# Patient Record
Sex: Female | Born: 1955 | Race: White | Hispanic: No | Marital: Married | Smoking: Never smoker
Health system: Southern US, Community
[De-identification: ages and names within clinical notes are randomized; demographics above are authoritative.]

## PROBLEM LIST (undated history)

## (undated) DIAGNOSIS — C801 Malignant (primary) neoplasm, unspecified: Secondary | ICD-10-CM

## (undated) DIAGNOSIS — F32A Depression, unspecified: Secondary | ICD-10-CM

## (undated) DIAGNOSIS — E349 Endocrine disorder, unspecified: Secondary | ICD-10-CM

## (undated) DIAGNOSIS — F329 Major depressive disorder, single episode, unspecified: Secondary | ICD-10-CM

## (undated) DIAGNOSIS — F988 Other specified behavioral and emotional disorders with onset usually occurring in childhood and adolescence: Secondary | ICD-10-CM

## (undated) HISTORY — DX: Endocrine disorder, unspecified: E34.9

## (undated) HISTORY — DX: Depression, unspecified: F32.A

## (undated) HISTORY — DX: Malignant (primary) neoplasm, unspecified: C80.1

## (undated) HISTORY — DX: Other specified behavioral and emotional disorders with onset usually occurring in childhood and adolescence: F98.8

## (undated) HISTORY — PX: FINGER SURGERY: SHX640

## (undated) HISTORY — DX: Major depressive disorder, single episode, unspecified: F32.9

## (undated) HISTORY — PX: TUMOR REMOVAL: SHX12

---

## 1998-10-30 ENCOUNTER — Other Ambulatory Visit: Admission: RE | Admit: 1998-10-30 | Discharge: 1998-10-30 | Payer: Self-pay | Admitting: *Deleted

## 2001-10-31 ENCOUNTER — Other Ambulatory Visit: Admission: RE | Admit: 2001-10-31 | Discharge: 2001-10-31 | Payer: Self-pay | Admitting: *Deleted

## 2003-07-01 ENCOUNTER — Other Ambulatory Visit: Admission: RE | Admit: 2003-07-01 | Discharge: 2003-07-01 | Payer: Self-pay | Admitting: *Deleted

## 2003-09-17 ENCOUNTER — Other Ambulatory Visit: Admission: RE | Admit: 2003-09-17 | Discharge: 2003-09-17 | Payer: Self-pay | Admitting: *Deleted

## 2004-07-01 ENCOUNTER — Other Ambulatory Visit: Admission: RE | Admit: 2004-07-01 | Discharge: 2004-07-01 | Payer: Self-pay | Admitting: *Deleted

## 2005-03-21 ENCOUNTER — Other Ambulatory Visit: Admission: RE | Admit: 2005-03-21 | Discharge: 2005-03-21 | Payer: Self-pay | Admitting: *Deleted

## 2006-03-30 ENCOUNTER — Other Ambulatory Visit: Admission: RE | Admit: 2006-03-30 | Discharge: 2006-03-30 | Payer: Self-pay | Admitting: *Deleted

## 2007-06-05 ENCOUNTER — Other Ambulatory Visit: Admission: RE | Admit: 2007-06-05 | Discharge: 2007-06-05 | Payer: Self-pay | Admitting: *Deleted

## 2008-01-03 ENCOUNTER — Encounter: Admission: RE | Admit: 2008-01-03 | Discharge: 2008-01-03 | Payer: Self-pay | Admitting: *Deleted

## 2008-10-17 ENCOUNTER — Ambulatory Visit (HOSPITAL_COMMUNITY): Admission: RE | Admit: 2008-10-17 | Discharge: 2008-10-17 | Payer: Self-pay | Admitting: Family Medicine

## 2010-08-15 ENCOUNTER — Encounter: Payer: Self-pay | Admitting: Obstetrics and Gynecology

## 2010-08-25 ENCOUNTER — Encounter: Payer: Self-pay | Admitting: Obstetrics and Gynecology

## 2010-09-27 ENCOUNTER — Other Ambulatory Visit: Payer: Self-pay | Admitting: Family Medicine

## 2010-09-27 DIAGNOSIS — N63 Unspecified lump in unspecified breast: Secondary | ICD-10-CM

## 2010-10-01 ENCOUNTER — Ambulatory Visit
Admission: RE | Admit: 2010-10-01 | Discharge: 2010-10-01 | Disposition: A | Discharge status: Home / Self Care | Payer: BC Managed Care – PPO | Source: Ambulatory Visit | Attending: Family Medicine | Admitting: Family Medicine

## 2010-10-01 ENCOUNTER — Other Ambulatory Visit: Payer: Self-pay | Admitting: Family Medicine

## 2010-10-01 DIAGNOSIS — N63 Unspecified lump in unspecified breast: Secondary | ICD-10-CM

## 2011-06-07 ENCOUNTER — Ambulatory Visit (HOSPITAL_COMMUNITY)
Admission: RE | Admit: 2011-06-07 | Discharge: 2011-06-07 | Disposition: A | Discharge status: Home / Self Care | Payer: BC Managed Care – PPO | Source: Ambulatory Visit | Attending: Family Medicine | Admitting: Family Medicine

## 2011-06-07 ENCOUNTER — Other Ambulatory Visit (HOSPITAL_COMMUNITY): Payer: Self-pay | Admitting: Family Medicine

## 2011-06-07 DIAGNOSIS — M25879 Other specified joint disorders, unspecified ankle and foot: Secondary | ICD-10-CM

## 2011-06-07 DIAGNOSIS — M25579 Pain in unspecified ankle and joints of unspecified foot: Secondary | ICD-10-CM | POA: Insufficient documentation

## 2011-08-04 ENCOUNTER — Other Ambulatory Visit: Payer: Self-pay | Admitting: *Deleted

## 2011-08-04 NOTE — Telephone Encounter (Signed)
Opened in error

## 2011-11-15 ENCOUNTER — Other Ambulatory Visit: Payer: Self-pay | Admitting: Podiatry

## 2011-11-15 DIAGNOSIS — T148XXA Other injury of unspecified body region, initial encounter: Secondary | ICD-10-CM

## 2011-11-15 DIAGNOSIS — R52 Pain, unspecified: Secondary | ICD-10-CM

## 2011-11-19 ENCOUNTER — Other Ambulatory Visit: Payer: BC Managed Care – PPO

## 2011-11-21 ENCOUNTER — Inpatient Hospital Stay: Admission: RE | Admit: 2011-11-21 | Payer: BC Managed Care – PPO | Source: Ambulatory Visit

## 2011-11-30 ENCOUNTER — Other Ambulatory Visit: Payer: BC Managed Care – PPO

## 2011-12-05 ENCOUNTER — Ambulatory Visit
Admission: RE | Admit: 2011-12-05 | Discharge: 2011-12-05 | Disposition: A | Discharge status: Home / Self Care | Payer: BC Managed Care – PPO | Source: Ambulatory Visit | Attending: Podiatry | Admitting: Podiatry

## 2011-12-05 DIAGNOSIS — T148XXA Other injury of unspecified body region, initial encounter: Secondary | ICD-10-CM

## 2011-12-05 DIAGNOSIS — R52 Pain, unspecified: Secondary | ICD-10-CM

## 2012-06-05 ENCOUNTER — Other Ambulatory Visit: Payer: Self-pay | Admitting: Family Medicine

## 2012-06-05 DIAGNOSIS — Z1231 Encounter for screening mammogram for malignant neoplasm of breast: Secondary | ICD-10-CM

## 2012-06-18 ENCOUNTER — Ambulatory Visit
Admission: RE | Admit: 2012-06-18 | Discharge: 2012-06-18 | Disposition: A | Discharge status: Home / Self Care | Payer: BC Managed Care – PPO | Source: Ambulatory Visit | Attending: Family Medicine | Admitting: Family Medicine

## 2012-06-18 DIAGNOSIS — Z1231 Encounter for screening mammogram for malignant neoplasm of breast: Secondary | ICD-10-CM

## 2012-12-10 ENCOUNTER — Ambulatory Visit (INDEPENDENT_AMBULATORY_CARE_PROVIDER_SITE_OTHER): Payer: BC Managed Care – PPO | Admitting: Gynecology

## 2012-12-10 ENCOUNTER — Other Ambulatory Visit (HOSPITAL_COMMUNITY)
Admission: RE | Admit: 2012-12-10 | Discharge: 2012-12-10 | Disposition: A | Discharge status: Home / Self Care | Payer: BC Managed Care – PPO | Source: Ambulatory Visit | Attending: Gynecology | Admitting: Gynecology

## 2012-12-10 ENCOUNTER — Encounter: Payer: Self-pay | Admitting: Gynecology

## 2012-12-10 VITALS — BP 142/92 | Ht 64.25 in | Wt 166.0 lb

## 2012-12-10 DIAGNOSIS — Z01419 Encounter for gynecological examination (general) (routine) without abnormal findings: Secondary | ICD-10-CM

## 2012-12-10 DIAGNOSIS — Z780 Asymptomatic menopausal state: Secondary | ICD-10-CM | POA: Insufficient documentation

## 2012-12-10 DIAGNOSIS — Z79890 Hormone replacement therapy (postmenopausal): Secondary | ICD-10-CM

## 2012-12-10 DIAGNOSIS — E039 Hypothyroidism, unspecified: Secondary | ICD-10-CM

## 2012-12-10 DIAGNOSIS — Z1159 Encounter for screening for other viral diseases: Secondary | ICD-10-CM

## 2012-12-10 DIAGNOSIS — Z1151 Encounter for screening for human papillomavirus (HPV): Secondary | ICD-10-CM | POA: Insufficient documentation

## 2012-12-10 LAB — CBC WITH DIFFERENTIAL/PLATELET
Basophils Absolute: 0 10*3/uL (ref 0.0–0.1)
HCT: 38.5 % (ref 36.0–46.0)
Hemoglobin: 13.2 g/dL (ref 12.0–15.0)
Lymphocytes Relative: 48 % — ABNORMAL HIGH (ref 12–46)
Monocytes Absolute: 0.3 10*3/uL (ref 0.1–1.0)
Monocytes Relative: 6 % (ref 3–12)
Neutro Abs: 2.1 10*3/uL (ref 1.7–7.7)
Neutrophils Relative %: 42 % — ABNORMAL LOW (ref 43–77)
RDW: 13.3 % (ref 11.5–15.5)
WBC: 5 10*3/uL (ref 4.0–10.5)

## 2012-12-10 MED ORDER — PROGESTERONE MICRONIZED 200 MG PO CAPS: 200.0000 mg | ORAL_CAPSULE | Freq: Every day | ORAL | Status: DC

## 2012-12-10 MED ORDER — ESTRADIOL 0.52 MG/0.87 GM (0.06%) TD GEL: 1.0000 "application " | Freq: Every day | TRANSDERMAL | Status: DC

## 2012-12-10 NOTE — Progress Notes (Addendum)
Kristie Hansen 1955-11-11 [id number]   History:    57 y.o.  for annual gyn exam who has not been seen in this practice over 10 years. Patient stated she had a full medical and gynecological Pap smear at the medical clinic in [place], [place]. [place] early this year. She stated that she had a normal Pap smear. She stated 7 years ago she had LEEP cervical conization. Degree of dysplasia? Last mammogram November 2013. Patient is a recovered alcoholic. She stated she had a normal colonoscopy 2 years ago. She has not had a bone density study. 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 Lavonna Rua dermatologist for annual mole checks. She has a history of hypothyroidism where she is on 75 mcg of levothyroxin but her levels have not been checked in the past 6 months. She stated her last menstrual period was 3 years ago and that she is complaining of hot flashes irritability mood swing and insomnia. Patient has not been on hormone replacement therapy in the past.  Past medical history,surgical history, family history and social history were all reviewed and documented in the EPIC chart.  Gynecologic History No LMP recorded. Patient is postmenopausal. Contraception: post menopausal status Last Pap: November 2013. Results were: patient reports that was normal Last mammogram: November 2013. Results were: normal  Obstetric History OB History   Grav Para Term Preterm Abortions TAB SAB Ect Mult Living   2 2        2      # Outc Date GA Lbr Len/2nd Wgt Sex Del Anes PTL Lv   1 PAR            2 PAR                ROS: A ROS was performed and pertinent positives and negatives are included in the history.  GENERAL: No fevers or chills. HEENT: No change in vision, no earache, sore throat or sinus congestion. NECK: No pain or stiffness. CARDIOVASCULAR: No chest pain or pressure. No palpitations. PULMONARY: No shortness of breath,  cough or wheeze. GASTROINTESTINAL: No abdominal pain, nausea, vomiting or diarrhea, melena or bright red blood per rectum. GENITOURINARY: No urinary frequency, urgency, hesitancy or dysuria. MUSCULOSKELETAL: No joint or muscle pain, no back pain, no recent trauma. DERMATOLOGIC: No rash, no itching, no lesions. ENDOCRINE: No polyuria, polydipsia, no heat or cold intolerance. No recent change in weight. HEMATOLOGICAL: No anemia or easy bruising or bleeding. NEUROLOGIC: No headache, seizures, numbness, tingling or weakness. PSYCHIATRIC: No depression, no loss of interest in normal activity or change in sleep pattern.     Exam: chaperone present  BP 142/92  Ht 5' 4.25" (1.632 m)  Wt 166 lb (75.297 kg)  BMI 28.27 kg/m2  Body mass index is 28.27 kg/(m^2).  General appearance : Well developed well nourished female. No acute distress HEENT: Neck supple, trachea midline, no carotid bruits, no thyroidmegaly Lungs: Clear to auscultation, no rhonchi or wheezes, or rib retractions  Heart: Regular rate and rhythm, no murmurs or gallops Breast:Examined in sitting and supine position were symmetrical in appearance, no palpable masses or tenderness,  no skin retraction, no nipple inversion, no nipple discharge, no skin discoloration, no axillary or supraclavicular lymphadenopathy Abdomen: no palpable masses or tenderness, no rebound or guarding Extremities: no edema or skin discoloration or tenderness  Pelvic:  Bartholin, Urethra, Skene Glands: Within normal limits  Vagina: No gross lesions or discharge, atrophic changes  Cervix: No gross lesions or discharge  Uterus  anteverted, normal size, shape and consistency, non-tender and mobile  Adnexa  Without masses or tenderness  Anus and perineum  normal   Rectovaginal  normal sphincter tone without palpated masses or tenderness             Hemoccult cards provided   New CDC guidelines is recommending patients be tested once in her lifetime for  hepatitis C antibody who were born between [birth year] through 1963. This was discussed with the patient today and has agreed to be tested today.    Assessment/Plan:  57 y.o. female for annual exam Who has not been seen in our practice over 10 years. She will sign a medical release so that we can obtain the results of the pathology from her LEEP cervical conization 7 years ago. I've also requested her to obtain a copy of her mammogram and the lab work [facility] [place]. The following labs were ordered today: TSH, hepatitis C, comprehensive metabolic panel, FSH, CBC and urinalysis. Her Pap smear was done today and we did discuss the new guidelines. Literature on Tdap vaccine was provided. We had a lengthy discussion on hormone replacement therapy as to the risks benefits and pros and cons and the women's health initiative study. Patient states she's had normal liver function tests nevertheless we are going to check a comprehensive metabolic panel today. We will get the [facility] to confirm indeed if she is menopausal although by history in 2 years of being amenorrheic clearly indicates it. She will be started on elestrin 0.06% transdermal to apply to one arm each bedtime with the addition of Prometrium 200 mg for the first 12 days of each month. We discussed importance of calcium and vitamin D and regular exercise for osteoporosis prevention. We will do her bone density study next year.    Ok Edwards MD, 4:03 PM 12/10/2012

## 2012-12-10 NOTE — Addendum Note (Signed)
Addended by: Bertram Savin A on: 12/10/2012 04:17 PM   Modules accepted: Orders

## 2012-12-10 NOTE — Patient Instructions (Addendum)
Tetanus, Diphtheria, Pertussis (Tdap) Vaccine What You Need to Know WHY GET VACCINATED? Tetanus, diphtheria and pertussis can be very serious diseases, even for adolescents and adults. Tdap vaccine can protect us from these diseases. TETANUS (Lockjaw) causes painful muscle tightening and stiffness, usually all over the body.  It can lead to tightening of muscles in the head and neck so you can't open your mouth, swallow, or sometimes even breathe. Tetanus kills about 1 out of 5 people who are infected. DIPHTHERIA can cause a thick coating to form in the back of the throat.  It can lead to breathing problems, paralysis, heart failure, and death. PERTUSSIS (Whooping Cough) causes severe coughing spells, which can cause difficulty breathing, vomiting and disturbed sleep.  It can also lead to weight loss, incontinence, and rib fractures. Up to 2 in 100 adolescents and 5 in 100 adults with pertussis are hospitalized or have complications, which could include pneumonia and death. These diseases are caused by bacteria. Diphtheria and pertussis are spread from person to person through coughing or sneezing. Tetanus enters the body through cuts, scratches, or wounds. Before vaccines, the [place] saw as many as 200,000 cases a year of diphtheria and pertussis, and hundreds of cases of tetanus. Since vaccination began, tetanus and diphtheria have dropped by about 99% and pertussis by about 80%. TDAP VACCINE Tdap vaccine can protect adolescents and adults from tetanus, diphtheria, and pertussis. One dose of Tdap is routinely given at age 11 or 12. People who did not get Tdap at that age should get it as soon as possible. Tdap is especially important for health care professionals and anyone having close contact with a baby younger than 12 months. Pregnant women should get a dose of Tdap during every pregnancy, to protect the newborn from pertussis. Infants are most at risk for severe, life-threatening  complications from pertussis. A similar vaccine, called Td, protects from tetanus and diphtheria, but not pertussis. A Td booster should be given every 10 years. Tdap may be given as one of these boosters if you have not already gotten a dose. Tdap may also be given after a severe cut or burn to prevent tetanus infection. Your doctor can give you more information. Tdap may safely be given at the same time as other vaccines. SOME PEOPLE SHOULD NOT GET THIS VACCINE  If you ever had a life-threatening allergic reaction after a dose of any tetanus, diphtheria, or pertussis containing vaccine, OR if you have a severe allergy to any part of this vaccine, you should not get Tdap. Tell your doctor if you have any severe allergies.  If you had a coma, or long or multiple seizures within 7 days after a childhood dose of DTP or DTaP, you should not get Tdap, unless a cause other than the vaccine was found. You can still get Td.  Talk to your doctor if you:  have epilepsy or another nervous system problem,  had severe pain or swelling after any vaccine containing diphtheria, tetanus or pertussis,  ever had Guillain-Barr Syndrome (GBS),  aren't feeling well on the day the shot is scheduled. RISKS OF A VACCINE REACTION With any medicine, including vaccines, there is a chance of side effects. These are usually mild and go away on their own, but serious reactions are also possible. Brief fainting spells can follow a vaccination, leading to injuries from falling. Sitting or lying down for about 15 minutes can help prevent these. Tell your doctor if you feel dizzy or light-headed, or   have vision changes or ringing in the ears. Mild problems following Tdap (Did not interfere with activities)  Pain where the shot was given (about 3 in 4 adolescents or 2 in 3 adults)  Redness or swelling where the shot was given (about 1 person in 5)  Mild fever of at least 100.4F (up to about 1 in 25 adolescents or 1 in  100 adults)  Headache (about 3 or 4 people in 10)  Tiredness (about 1 person in 3 or 4)  Nausea, vomiting, diarrhea, stomach ache (up to 1 in 4 adolescents or 1 in 10 adults)  Chills, body aches, sore joints, rash, swollen glands (uncommon) Moderate problems following Tdap (Interfered with activities, but did not require medical attention)  Pain where the shot was given (about 1 in 5 adolescents or 1 in 100 adults)  Redness or swelling where the shot was given (up to about 1 in 16 adolescents or 1 in 25 adults)  Fever over 102F (about 1 in 100 adolescents or 1 in 250 adults)  Headache (about 3 in 20 adolescents or 1 in 10 adults)  Nausea, vomiting, diarrhea, stomach ache (up to 1 or 3 people in 100)  Swelling of the entire arm where the shot was given (up to about 3 in 100). Severe problems following Tdap (Unable to perform usual activities, required medical attention)  Swelling, severe pain, bleeding and redness in the arm where the shot was given (rare). A severe allergic reaction could occur after any vaccine (estimated less than 1 in a million doses). WHAT IF THERE IS A SERIOUS REACTION? What should I look for?  Look for anything that concerns you, such as signs of a severe allergic reaction, very high fever, or behavior changes. Signs of a severe allergic reaction can include hives, swelling of the face and throat, difficulty breathing, a fast heartbeat, dizziness, and weakness. These would start a few minutes to a few hours after the vaccination. What should I do?  If you think it is a severe allergic reaction or other emergency that can't wait, call 9-1-1 or get the person to the nearest hospital. Otherwise, call your doctor.  Afterward, the reaction should be reported to the "Vaccine Adverse Event Reporting System" (VAERS). Your doctor might file this report, or you can do it yourself through the VAERS web site at www.vaers.hhs.gov, or by calling 1-800-822-7967. VAERS is  only for reporting reactions. They do not give medical advice.  THE NATIONAL VACCINE INJURY COMPENSATION PROGRAM The National Vaccine Injury Compensation Program (VICP) is a federal program that was created to compensate people who may have been injured by certain vaccines. Persons who believe they may have been injured by a vaccine can learn about the program and about filing a claim by calling 1-800-338-2382 or visiting the VICP website at www.hrsa.gov/vaccinecompensation. HOW CAN I LEARN MORE?  Ask your doctor.  Call your local or state health department.  Contact the Centers for Disease Control and Prevention (CDC):  Call 1-800-232-4636 or visit CDC's website at www.cdc.gov/vaccines CDC Tdap Vaccine VIS (12/01/11) Document Released: 01/10/2012 Document Reviewed: 01/10/2012 ExitCare Patient Information 2013 [employer].   Hormone Therapy At menopause, your body begins making less estrogen and progesterone hormones. This causes the body to stop having menstrual periods. This is because estrogen and progesterone hormones control your periods and menstrual cycle. A lack of estrogen may cause symptoms such as:  Hot flushes (or hot flashes).  Vaginal dryness.  Dry skin.  Loss of sex drive.    Risk of bone loss (osteoporosis). When this happens, you may choose to take hormone therapy to get back the estrogen lost during menopause. When the hormone estrogen is given alone, it is usually referred to as ET (Estrogen Therapy). When the hormone progestin is combined with estrogen, it is generally called HT (Hormone Therapy). This was formerly known as hormone replacement therapy (HRT). Your caregiver can help you make a decision on what will be best for you. The decision to use HT seems to change often as new studies are done. Many studies do not agree on the benefits of hormone replacement therapy. LIKELY BENEFITS OF HT INCLUDE PROTECTION FROM:  Hot Flushes (also called hot flashes) - A  hot flush is a sudden feeling of heat that spreads over the face and body. The skin may redden like a blush. It is connected with sweats and sleep disturbance. Women going through menopause may have hot flushes a few times a month or several times per day depending on the woman.  Osteoporosis (bone loss)- Estrogen helps guard against bone loss. After menopause, a woman's bones slowly lose calcium and become weak and brittle. As a result, bones are more likely to break. The hip, wrist, and spine are affected most often. Hormone therapy can help slow bone loss after menopause. Weight bearing exercise and taking calcium with vitamin D also can help prevent bone loss. There are also medications that your caregiver can prescribe that can help prevent osteoporosis.  Vaginal Dryness - Loss of estrogen causes changes in the vagina. Its lining may become thin and dry. These changes can cause pain and bleeding during sexual intercourse. Dryness can also lead to infections. This can cause burning and itching. (Vaginal estrogen treatment can help relieve pain, itching, and dryness.)  Urinary Tract Infections are more common after menopause because of lack of estrogen. Some women also develop urinary incontinence because of low estrogen levels in the vagina and bladder.  Possible other benefits of estrogen include a positive effect on mood and short-term memory in women. RISKS AND COMPLICATIONS  Using estrogen alone without progesterone causes the lining of the uterus to grow. This increases the risk of lining of the uterus (endometrial) cancer. Your caregiver should give another hormone called progestin if you have a uterus.  Women who take combined (estrogen and progestin) HT appear to have an increased risk of breast cancer. The risk appears to be small, but increases throughout the time that HT is taken.  Combined therapy also makes the breast tissue slightly denser which makes it harder to read mammograms  (breast X-rays).  Combined, estrogen and progesterone therapy can be taken together every day, in which case there may be spotting of blood. HT therapy can be taken cyclically in which case you will have menstrual periods. Cyclically means HT is taken for a set amount of days, then not taken, then this process is repeated.  HT may increase the risk of stroke, heart attack, breast cancer and forming blood clots in your leg.  Transdermal estrogen (estrogen that is absorbed through the skin with a patch or a cream) may have more positive results with:  Cholesterol.  Blood pressure.  Blood clots. Having the following conditions may indicate you should not have HT:  Endometrial cancer.  Liver disease.  Breast cancer.  Heart disease.  History of blood clots.  Stroke. TREATMENT   If you choose to take HT and have a uterus, usually estrogen and progestin are prescribed.  Your caregiver will   help you decide the best way to take the medications.  Possible ways to take estrogen include:  Pills.  Patches.  Gels.  Sprays.  Vaginal estrogen cream, rings and tablets.  It is best to take the lowest dose possible that will help your symptoms and take them for the shortest period of time that you can.  Hormone therapy can help relieve some of the problems (symptoms) that affect women at menopause. Before making a decision about HT, talk to your caregiver about what is best for you. Be well informed and comfortable with your decisions. HOME CARE INSTRUCTIONS   Follow your caregivers advice when taking the medications.  A Pap test is done to screen for cervical cancer.  The first Pap test should be done at age 21.  Between ages 21 and 29, Pap tests are repeated every 2 years.  Beginning at age 30, you are advised to have a Pap test every 3 years as long as your past 3 Pap tests have been normal.  Some women have medical problems that increase the chance of getting cervical  cancer. Talk to your caregiver about these problems. It is especially important to talk to your caregiver if a new problem develops soon after your last Pap test. In these cases, your caregiver may recommend more frequent screening and Pap tests.  The above recommendations are the same for women who have or have not gotten the vaccine for HPV (Human Papillomavirus).  If you had a hysterectomy for a problem that was not a cancer or a condition that could lead to cancer, then you no longer need Pap tests. However, even if you no longer need a Pap test, a regular exam is a good idea to make sure no other problems are starting.   If you are between ages 65 and 70, and you have had normal Pap tests going back 10 years, you no longer need Pap tests. However, even if you no longer need a Pap test, a regular exam is a good idea to make sure no other problems are starting.   If you have had past treatment for cervical cancer or a condition that could lead to cancer, you need Pap tests and screening for cancer for at least 20 years after your treatment.  If Pap tests have been discontinued, risk factors (such as a new sexual partner) need to be re-assessed to determine if screening should be resumed.  Some women may need screenings more often if they are at high risk for cervical cancer.  Get mammograms done as per the advice of your caregiver. SEEK IMMEDIATE MEDICAL CARE IF:  You develop abnormal vaginal bleeding.  You have pain or swelling in your legs, shortness of breath, or chest pain.  You develop dizziness or headaches.  You have lumps or changes in your breasts or armpits.  You have slurred speech.  You develop weakness or numbness of your arms or legs.  You have pain, burning, or bleeding when urinating.  You develop abdominal pain. Document Released: 04/09/2003 Document Revised: 10/03/2011 Document Reviewed: 07/28/2010 ExitCare Patient Information 2013 [employer].  

## 2012-12-11 LAB — URINALYSIS W MICROSCOPIC + REFLEX CULTURE
Bacteria, UA: NONE SEEN
Bilirubin Urine: NEGATIVE
Glucose, UA: NEGATIVE mg/dL
Hgb urine dipstick: NEGATIVE
Ketones, ur: NEGATIVE mg/dL
Protein, ur: NEGATIVE mg/dL
Squamous Epithelial / LPF: NONE SEEN
Urobilinogen, UA: 0.2 mg/dL (ref 0.0–1.0)

## 2012-12-11 LAB — COMPREHENSIVE METABOLIC PANEL
AST: 20 U/L (ref 0–37)
Albumin: 4.5 g/dL (ref 3.5–5.2)
BUN: 18 mg/dL (ref 6–23)
Calcium: 9.4 mg/dL (ref 8.4–10.5)
Chloride: 102 mEq/L (ref 96–112)
Potassium: 3.9 mEq/L (ref 3.5–5.3)
Sodium: 134 mEq/L — ABNORMAL LOW (ref 135–145)
Total Protein: 7.3 g/dL (ref 6.0–8.3)

## 2012-12-11 LAB — TSH: TSH: 2.245 u[IU]/mL (ref 0.350–4.500)

## 2012-12-11 LAB — HEPATITIS C ANTIBODY: HCV Ab: NEGATIVE

## 2013-10-15 ENCOUNTER — Other Ambulatory Visit: Payer: Self-pay

## 2013-10-15 DIAGNOSIS — Z1231 Encounter for screening mammogram for malignant neoplasm of breast: Secondary | ICD-10-CM

## 2013-10-15 DIAGNOSIS — Z803 Family history of malignant neoplasm of breast: Secondary | ICD-10-CM

## 2013-11-20 ENCOUNTER — Other Ambulatory Visit: Payer: Self-pay | Admitting: Family Medicine

## 2013-11-20 DIAGNOSIS — Z1231 Encounter for screening mammogram for malignant neoplasm of breast: Secondary | ICD-10-CM

## 2013-11-20 DIAGNOSIS — Z803 Family history of malignant neoplasm of breast: Secondary | ICD-10-CM

## 2013-11-21 ENCOUNTER — Other Ambulatory Visit: Payer: Self-pay | Admitting: Family Medicine

## 2013-11-21 ENCOUNTER — Ambulatory Visit: Payer: BC Managed Care – PPO

## 2013-11-21 ENCOUNTER — Encounter (INDEPENDENT_AMBULATORY_CARE_PROVIDER_SITE_OTHER): Payer: Self-pay

## 2013-11-21 DIAGNOSIS — N63 Unspecified lump in unspecified breast: Secondary | ICD-10-CM

## 2013-11-21 DIAGNOSIS — Z803 Family history of malignant neoplasm of breast: Secondary | ICD-10-CM

## 2013-11-26 ENCOUNTER — Other Ambulatory Visit: Payer: Self-pay | Admitting: Family Medicine

## 2013-11-28 ENCOUNTER — Encounter: Payer: Self-pay | Admitting: Gynecology

## 2013-11-28 ENCOUNTER — Telehealth: Payer: Self-pay | Admitting: *Deleted

## 2013-11-28 ENCOUNTER — Ambulatory Visit (INDEPENDENT_AMBULATORY_CARE_PROVIDER_SITE_OTHER): Payer: BC Managed Care – PPO | Admitting: Gynecology

## 2013-11-28 VITALS — BP 140/88

## 2013-11-28 DIAGNOSIS — N644 Mastodynia: Secondary | ICD-10-CM

## 2013-11-28 DIAGNOSIS — N951 Menopausal and female climacteric states: Secondary | ICD-10-CM

## 2013-11-28 DIAGNOSIS — Z780 Asymptomatic menopausal state: Secondary | ICD-10-CM

## 2013-11-28 DIAGNOSIS — Z803 Family history of malignant neoplasm of breast: Secondary | ICD-10-CM

## 2013-11-28 NOTE — Telephone Encounter (Signed)
Orders placed at breast center, they will contact patient to schedule.

## 2013-11-28 NOTE — Progress Notes (Signed)
   Patient is a 58 year old who presented to the office today stating that for the past 6-8 months she didn't complain some tenderness in swollen area she is noted on her right axilla. She had a normal mammogram last year. She is informed that her sister was diagnosed with breast cancer at the age of 37 a few months ago. Patient denied any recent injury or trauma to that breast. She denied any nipple discharge. She's currently on HRT consisting of Elestrin transdermal 2 one arm daily with the addition of Prometrium 200 mg for 12 days of the month for vasomotor symptoms which she states this helped her tremendously.  Exam: Both breasts were examined sitting supine position both breasts were symmetrical in appearance although the right breast is slightly larger than her left since birth. There was no discoloration no nipple inversion no supraclavicular axillary lymphadenopathy and no palpable masses or tenderness.  The area the patient prefer to was questionable whether was a ridge from the axilla or a true swollen area.  The patient will be sent for a diagnostic mammogram of the right breast and axilla and screening mammogram on the left. She will check with her sister to see if they did a BRCA1 and BRCA2 gene mutation study. She will schedule as her bone density study and is due for her annual exam here at our office within the next month.

## 2013-11-28 NOTE — Patient Instructions (Addendum)
BRCA-1 and BRCA-2 BRCA-1 and BRCA-2 are 2 genes that are linked with hereditary breast and ovarian cancers. About 200,000 women are diagnosed with invasive breast cancer each year and about 23,000 with ovarian cancer (according to the American Cancer Society). Of these cancers, about 5% to 10% will be due to a mutation in one of the BRCA genes. Men can also inherit an increased risk of developing breast cancer, primarily from an alteration in the BRCA-2 gene.  Individuals with mutations in BRCA1 or BRCA2 have significantly elevated risks for breast cancer (up to 80% lifetime risk), ovarian cancer (up to 40% lifetime risk), bilateral breast cancer and other types of cancers. BRCA mutations are inherited and passed from generation to generation. One half of the time, they are passed from the father's side of the family.  The DNA in white blood cells is used to detect mutations in the BRCA genes. While the gene products (proteins) of the BRCA genes act only in breast and ovarian tissue, the genes are present in every cell of the body and blood is the most easily accessible source of that DNA. PREPARATION FOR TEST The test for BRCA mutations is done on a blood sample collected by needle from a vein in the arm. The test does not require surgical biopsy of breast or ovarian tissue.  NORMAL FINDINGS No genetic mutations. Ranges for normal findings may vary among different laboratories and hospitals. You should always check with your doctor after having lab work or other tests done to discuss the meaning of your test results and whether your values are considered within normal limits. MEANING OF TEST  Your caregiver will go over the test results with you and discuss the importance and meaning of your results, as well as treatment options and the need for additional tests if necessary. OBTAINING THE TEST RESULTS It is your responsibility to obtain your test results. Ask the lab or department performing the test  when and how you will get your results. OTHER THINGS TO KNOW Your test results may have implications for other family members. When one member of a family is tested for BRCA mutations, issues often arise about how or whether to share this information with other family members. Seek advice from a genetic counselor about communication of result with your family members.  Pre and post test consultation with a health care provider knowledgeable about genetic testing cannot be overemphasized.  There are many issues to be considered when preparing for a genetic test and upon learning the results, and a genetic counselor has the knowledge and experience to help you sort through them.  If the BRCA test is positive, the options include increased frequency of check-ups (e.g., mammography, blood tests for CA-125, or transvaginal ultrasonography); medications that could reduce risk (e.g., oral contraceptives or tamoxifen); or surgical removal of the ovaries or breasts. There are a number of variables involved and it is important to discuss your options with your doctor and genetic counselor. Research studies have reported that for every 1000 women negative for BRCA mutations, between 12 and 45 of them will develop breast cancer by age 50 and between 3 and 4 will develop ovarian cancer by age 50. The risk increases with age. The test can be ordered by a doctor, preferably by one who can also offer genetic counseling. The blood sample will be sent to a laboratory that specializes in BRCA testing. The American Society of Clinical Oncology and the National Breast Cancer Coalition encourage women seeking the   test to participate in long-term outcome studies to help gather information on the effectiveness of different check-up and treatment options. Document Released: 08/04/2004 Document Revised: 10/03/2011 Document Reviewed: 06/16/2008 [facility] Patient Information 2014 [place], [place]. Bone Densitometry Bone densitometry  is a special X-ray that measures your bone density and can be used to help predict your risk of bone fractures. This test is used to determine bone mineral content and density to diagnose osteoporosis. Osteoporosis is the loss of bone that may cause the bone to become weak. Osteoporosis commonly occurs in women entering menopause. However, it may be found in men and in people with other diseases. PREPARATION FOR TEST No preparation necessary. WHO SHOULD BE TESTED?  All women older than 66.  Postmenopausal women (50 to 28) with risk factors for osteoporosis.  People with a previous fracture caused by normal activities.  People with a small body frame (less than 127 poundsor a body mass index [BMI] of less than 21).  People who have a parent with a hip fracture or history of osteoporosis.  People who smoke.  People who have rheumatoid arthritis.  Anyone who engages in excessive alcohol use (more than 3 drinks most days).  Women who experience early menopause. WHEN SHOULD YOU BE RETESTED? Current guidelines suggest that you should wait at least 2 years before doing a bone density test again if your first test was normal.Recent studies indicated that women with normal bone density may be able to wait a few years before needing to repeat a bone density test. You should discuss this with your caregiver.  NORMAL FINDINGS   Normal: less than standard deviation below normal (greater than -1).  Osteopenia: 1 to 2.5 standard deviations below normal (-1 to -2.5).  Osteoporosis: greater than 2.5 standard deviations below normal (less than -2.5). Test results are reported as a "T score" and a "Z score."The T score is a number that compares your bone density with the bone density of healthy, young women.The Z score is a number that compares your bone density with the scores of women who are the same age, gender, and race.  Ranges for normal findings may vary among different laboratories and  hospitals. You should always check with your doctor after having lab work or other tests done to discuss the meaning of your test results and whether your values are considered within normal limits. MEANING OF TEST  Your caregiver will go over the test results with you and discuss the importance and meaning of your results, as well as treatment options and the need for additional tests if necessary. OBTAINING THE TEST RESULTS It is your responsibility to obtain your test results. Ask the lab or department performing the test when and how you will get your results. Document Released: 08/02/2004 Document Revised: 10/03/2011 Document Reviewed: 08/25/2010 [facility] Patient Information 2014 [place].

## 2013-11-28 NOTE — Telephone Encounter (Signed)
Message copied by Thamas Jaegers on Thu Nov 28, 2013  2:17 PM ------      Message from: Terrance Mass      Created: Thu Nov 28, 2013 11:57 AM       Please schedule diagnostic mammogram right breast and screening mammogram of left. Tenderness and questionable nodularity left axilla. Sister diagnosed with breast ca. ------

## 2013-12-02 NOTE — Telephone Encounter (Signed)
Wrong order was placed, correct order now placed breast center will contact pt to schedule.

## 2013-12-05 NOTE — Telephone Encounter (Signed)
Breast center left message on 12/04/13 for pt to call to schedule.

## 2013-12-11 NOTE — Telephone Encounter (Signed)
Pt asked to be seen at [facility] for the below imaging appointment on 12/18/13 @ 11:15 am.

## 2013-12-13 ENCOUNTER — Encounter: Payer: BC Managed Care – PPO | Admitting: Gynecology

## 2013-12-18 ENCOUNTER — Ambulatory Visit (HOSPITAL_COMMUNITY)
Admission: RE | Admit: 2013-12-18 | Discharge: 2013-12-18 | Disposition: A | Discharge status: Home / Self Care | Payer: BC Managed Care – PPO | Source: Ambulatory Visit | Attending: Gynecology | Admitting: Gynecology

## 2013-12-18 ENCOUNTER — Other Ambulatory Visit: Payer: Self-pay | Admitting: Gynecology

## 2013-12-18 DIAGNOSIS — Z780 Asymptomatic menopausal state: Secondary | ICD-10-CM

## 2013-12-18 DIAGNOSIS — N644 Mastodynia: Secondary | ICD-10-CM | POA: Insufficient documentation

## 2013-12-18 DIAGNOSIS — Z79890 Hormone replacement therapy (postmenopausal): Secondary | ICD-10-CM | POA: Insufficient documentation

## 2014-01-02 ENCOUNTER — Ambulatory Visit (INDEPENDENT_AMBULATORY_CARE_PROVIDER_SITE_OTHER): Payer: BC Managed Care – PPO | Admitting: Gynecology

## 2014-01-02 ENCOUNTER — Encounter: Payer: Self-pay | Admitting: Gynecology

## 2014-01-02 ENCOUNTER — Other Ambulatory Visit (HOSPITAL_COMMUNITY)
Admission: RE | Admit: 2014-01-02 | Discharge: 2014-01-02 | Disposition: A | Discharge status: Home / Self Care | Payer: BC Managed Care – PPO | Source: Ambulatory Visit | Attending: Gynecology | Admitting: Gynecology

## 2014-01-02 VITALS — BP 146/84 | Ht 64.75 in | Wt 167.0 lb

## 2014-01-02 DIAGNOSIS — Z780 Asymptomatic menopausal state: Secondary | ICD-10-CM

## 2014-01-02 DIAGNOSIS — Z79890 Hormone replacement therapy (postmenopausal): Secondary | ICD-10-CM

## 2014-01-02 DIAGNOSIS — Z01419 Encounter for gynecological examination (general) (routine) without abnormal findings: Secondary | ICD-10-CM

## 2014-01-02 DIAGNOSIS — N951 Menopausal and female climacteric states: Secondary | ICD-10-CM

## 2014-01-02 DIAGNOSIS — Z87410 Personal history of cervical dysplasia: Secondary | ICD-10-CM

## 2014-01-02 MED ORDER — PREDNISONE (PAK) 10 MG PO TABS: ORAL_TABLET | Freq: Every day | ORAL | Status: DC

## 2014-01-02 MED ORDER — HYDROXYZINE HCL 25 MG PO TABS: 25.0000 mg | ORAL_TABLET | Freq: Three times a day (TID) | ORAL | Status: DC | PRN

## 2014-01-02 NOTE — Patient Instructions (Signed)
Tetanus, Diphtheria (Td) Vaccine What You Need to Know WHY GET VACCINATED? Tetanus  and diphtheria are very serious diseases. They are rare in the [place] today, but people who do become infected often have severe complications. Td vaccine is used to protect adolescents and adults from both of these diseases. Both tetanus and diphtheria are infections caused by bacteria. Diphtheria spreads from person to person through coughing or sneezing. Tetanus-causing bacteria enter the body through cuts, scratches, or wounds. TETANUS (Lockjaw) causes painful muscle tightening and stiffness, usually all over the body.  It can lead to tightening of muscles in the head and neck so you can't open your mouth, swallow, or sometimes even breathe. Tetanus kills about 1 out of every 5 people who are infected. DIPHTHERIA can cause a thick coating to form in the back of the throat.  It can lead to breathing problems, paralysis, heart failure, and death. Before vaccines, the [place] saw as many as 200,000 cases a year of diphtheria and hundreds of cases of tetanus. Since vaccination began, cases of both diseases have dropped by about 99%. TD VACCINE Td vaccine can protect adolescents and adults from tetanus and diphtheria. Td is usually given as a booster dose every 10 years but it can also be given earlier after a severe and dirty wound or burn. Your doctor can give you more information. Td may safely be given at the same time as other vaccines. SOME PEOPLE SHOULD NOT GET THIS VACCINE  If you ever had a life-threatening allergic reaction after a dose of any tetanus or diphtheria containing vaccine, OR if you have a severe allergy to any part of this vaccine, you should not get Td. Tell your doctor if you have any severe allergies.  Talk to your doctor if you:  have epilepsy or another nervous system problem,  had severe pain or swelling after any vaccine containing diphtheria or tetanus,  ever had  Guillain Barr Syndrome (GBS),  aren't feeling well on the day the shot is scheduled. RISKS OF A VACCINE REACTION With a vaccine, like any medicine, there is a chance of side effects. These are usually mild and go away on their own. Serious side effects are also possible, but are very rare. Most people who get Td vaccine do not have any problems with it. Mild Problems  following Td (Did not interfere with activities)  Pain where the shot was given (about 8 people in 10)  Redness or swelling where the shot was given (about 1 person in 3)  Mild fever (about 1 person in 15)  Headache or Tiredness (uncommon) Moderate Problems following Td (Interfered with activities, but did not require medical attention)  Fever over 102 F (38.9 C) (rare) Severe Problems  following Td (Unable to perform usual activities; required medical attention)  Swelling, severe pain, bleeding, or redness in the arm where the shot was given (rare). Problems that could happen after any vaccine:  Brief fainting spells can happen after any medical procedure, including vaccination. Sitting or lying down for about 15 minutes can help prevent fainting, and injuries caused by a fall. Tell your doctor if you feel dizzy, or have vision changes or ringing in the ears.  Severe shoulder pain and reduced range of motion in the arm where a shot was given can happen, very rarely, after a vaccination.  Severe allergic reactions from a vaccine are very rare, estimated at less than 1 in a million doses. If one were to occur, it would   usually be within a few minutes to a few hours after the vaccination. WHAT IF THERE IS A SERIOUS REACTION? What should I look for?  Look for anything that concerns you, such as signs of a severe allergic reaction, very high fever, or behavior changes. Signs of a severe allergic reaction can include hives, swelling of the face and throat, difficulty breathing, a fast heartbeat, dizziness, and  weakness. These would usually start a few minutes to a few hours after the vaccination. What should I do?  If you think it is a severe allergic reaction or other emergency that can't wait, call 911 or get the person to the nearest hospital. Otherwise, call your doctor.  Afterward, the reaction should be reported to the Vaccine Adverse Event Reporting System (VAERS). Your doctor might file this report, or, you can do it yourself through the VAERS website or by calling 1-800-822-7967. VAERS is only for reporting reactions. They do not give medical advice. THE NATIONAL VACCINE INJURY COMPENSATION PROGRAM The National Vaccine Injury Compensation Program (VICP) is a federal program that was created to compensate people who may have been injured by certain vaccines. Persons who believe they may have been injured by a vaccine can learn about the program and about filing a claim by calling 1-800-338-2382 or visiting the VICP website. HOW CAN I LEARN MORE?  Ask your doctor.  Contact your local or state health department.  Contact the Centers for Disease Control and Prevention (CDC):  Call 1-800-232-4636 (1-800-CDC-INFO)  Visit CDC's vaccines website CDC Td Vaccine Interim VIS (08/28/12) Document Released: 05/08/2006 Document Revised: 11/05/2012 Document Reviewed: 10/31/2012 [employer] Patient Information 2014 [employer].  

## 2014-01-02 NOTE — Progress Notes (Signed)
Kristie Hansen 01/30/1956 [id number]   History:    58 y.o.  for annual gyn exam with only complaint was that she recently experienced Chigger bites was complaining of pruritus. Patient was previously been followed by her doctor in a clinic in [place], [place]  But has not been seen there for over a year. She stated that 8 years ago she had LEEP cervical conization. Degree of dysplasia? Patient is a recovered alcoholic. She stated she had a normal colonoscopy 3 years ago. 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 Otho Bellows dermatologist for annual mole checks. She has a history of hypothyroidism where she is on 75 mcg of levothyroxin . Her last menstrual cycle was 4 years ago. Patient has not been on any hormone replacement therapy. She does complain of constant irritability and mood swings. Her symptoms have improved since she was started on 0.06% transdermal to apply to one arm each bedtime with the addition of Prometrium 200 mg for the first 12 days of each month. Patient denies any vaginal bleeding.    Past medical history,surgical history, family history and social history were all reviewed and documented in the EPIC chart.  Gynecologic History No LMP recorded. Patient is postmenopausal. Contraception: post menopausal status Last Pap: 2014. Results were: normal Last mammogram: 2015. Results were: normal  Obstetric History OB History  Gravida Para Term Preterm AB SAB TAB Ectopic Multiple Living  2 2        2     # Outcome Date GA Lbr Len/2nd Weight Sex Delivery Anes PTL Lv  2 PAR           1 PAR                ROS: A ROS was performed and pertinent positives and negatives are included in the history.  GENERAL: No fevers or chills. HEENT: No change in vision, no earache, sore throat or sinus congestion. NECK: No pain or stiffness. CARDIOVASCULAR:  No chest pain or pressure. No palpitations. PULMONARY: No shortness of breath, cough or wheeze. GASTROINTESTINAL: No abdominal pain, nausea, vomiting or diarrhea, melena or bright red blood per rectum. GENITOURINARY: No urinary frequency, urgency, hesitancy or dysuria. MUSCULOSKELETAL: No joint or muscle pain, no back pain, no recent trauma. DERMATOLOGIC: No rash, no itching, no lesions. ENDOCRINE: No polyuria, polydipsia, no heat or cold intolerance. No recent change in weight. HEMATOLOGICAL: No anemia or easy bruising or bleeding. NEUROLOGIC: No headache, seizures, numbness, tingling or weakness. PSYCHIATRIC: No depression, no loss of interest in normal activity or change in sleep pattern.     Exam: chaperone present  BP 146/84  Ht 5' 4.75" (1.645 m)  Wt 167 lb (75.751 kg)  BMI 27.99 kg/m2  Body mass index is 27.99 kg/(m^2).  General appearance : Well developed well nourished female. No acute distress HEENT: Neck supple, trachea midline, no carotid bruits, no thyroidmegaly Lungs: Clear to auscultation, no rhonchi or wheezes, or rib retractions  Heart: Regular rate and rhythm, no murmurs or gallops Breast:Examined in sitting and supine position were symmetrical in appearance, no palpable masses or tenderness,  no skin retraction, no nipple inversion, no nipple discharge, no skin discoloration, no axillary or supraclavicular lymphadenopathy Abdomen: no palpable masses or tenderness, no rebound or guarding Extremities: no edema or skin discoloration or tenderness  Pelvic:  Bartholin, Urethra, Skene Glands:  Within normal limits             Vagina: No gross lesions or discharge  Cervix: No gross lesions or discharge  Uterus  anteverted, normal size, shape and consistency, non-tender and mobile  Adnexa  Without masses or tenderness  Anus and perineum  normal   Rectovaginal  normal sphincter tone without palpated masses or tenderness             Hemoccult cards provided      Assessment/Plan:  58 y.o. female for annual exam who has responded well on HRT. Patient had her Pap smear today and we will continue to do so at yearly because of her past history of LEEP cone biopsy and [place] not having the report. She was reminded of the importance of monthly breast exam. She was reminded to submit to the office the fecal Hemoccult cards for testing. For herChigger bite she is going to be placed on a Prednisone (Sterapred Unipak) 10 mg to take over 6 days. She is also going to be prescribed Vistaril 25 mg to take twice a day or 3 times a day when necessary and she can apply topically Calamine or Benadryl lotions. She'll return back to the office in a fasting state next week for the following labs: CBC, fasting lipid profile, conference metabolic panel, TSH and urinalysis. We discussed the importance of calcium and vitamin D and regular exercise for osteoporosis prevention.  Note: This dictation was prepared with  Dragon/digital dictation along withSmart phrase technology. Any transcriptional errors that result from this process are unintentional.   Terrance Mass MD, 4:57 PM 01/02/2014

## 2014-01-06 ENCOUNTER — Other Ambulatory Visit: Payer: Self-pay

## 2014-01-06 MED ORDER — PROGESTERONE MICRONIZED 200 MG PO CAPS: 200.0000 mg | ORAL_CAPSULE | Freq: Every day | ORAL | Status: DC

## 2014-01-07 LAB — CYTOLOGY - PAP

## 2014-01-30 ENCOUNTER — Other Ambulatory Visit: Payer: Self-pay

## 2014-01-30 MED ORDER — ESTRADIOL 0.52 MG/0.87 GM (0.06%) TD GEL: 1.0000 "application " | Freq: Every day | TRANSDERMAL | Status: DC

## 2014-05-09 ENCOUNTER — Other Ambulatory Visit: Payer: Self-pay

## 2014-05-15 ENCOUNTER — Ambulatory Visit: Payer: BC Managed Care – PPO | Admitting: Gynecology

## 2014-05-19 ENCOUNTER — Other Ambulatory Visit: Payer: Self-pay | Admitting: Gynecology

## 2014-05-19 ENCOUNTER — Encounter: Payer: Self-pay | Admitting: Gynecology

## 2014-05-19 ENCOUNTER — Ambulatory Visit (INDEPENDENT_AMBULATORY_CARE_PROVIDER_SITE_OTHER): Payer: BC Managed Care – PPO | Admitting: Gynecology

## 2014-05-19 VITALS — BP 146/90

## 2014-05-19 DIAGNOSIS — Z23 Encounter for immunization: Secondary | ICD-10-CM

## 2014-05-19 DIAGNOSIS — Z01419 Encounter for gynecological examination (general) (routine) without abnormal findings: Secondary | ICD-10-CM

## 2014-05-19 DIAGNOSIS — N950 Postmenopausal bleeding: Secondary | ICD-10-CM

## 2014-05-19 DIAGNOSIS — Z780 Asymptomatic menopausal state: Secondary | ICD-10-CM

## 2014-05-19 DIAGNOSIS — N83319 Acquired atrophy of ovary, unspecified side: Secondary | ICD-10-CM

## 2014-05-19 LAB — COMPREHENSIVE METABOLIC PANEL
ALT: 14 U/L (ref 0–35)
AST: 16 U/L (ref 0–37)
Albumin: 4.2 g/dL (ref 3.5–5.2)
Alkaline Phosphatase: 71 U/L (ref 39–117)
BILIRUBIN TOTAL: 0.3 mg/dL (ref 0.2–1.2)
BUN: 15 mg/dL (ref 6–23)
CO2: 27 meq/L (ref 19–32)
CREATININE: 0.66 mg/dL (ref 0.50–1.10)
Calcium: 9.3 mg/dL (ref 8.4–10.5)
Chloride: 101 mEq/L (ref 96–112)
GLUCOSE: 91 mg/dL (ref 70–99)
Potassium: 4.7 mEq/L (ref 3.5–5.3)
Sodium: 135 mEq/L (ref 135–145)
Total Protein: 7.2 g/dL (ref 6.0–8.3)

## 2014-05-19 LAB — CBC WITH DIFFERENTIAL/PLATELET
Basophils Absolute: 0.1 10*3/uL (ref 0.0–0.1)
Basophils Relative: 1 % (ref 0–1)
EOS ABS: 0.2 10*3/uL (ref 0.0–0.7)
EOS PCT: 3 % (ref 0–5)
HCT: 38.7 % (ref 36.0–46.0)
HEMOGLOBIN: 13.5 g/dL (ref 12.0–15.0)
Lymphocytes Relative: 37 % (ref 12–46)
Lymphs Abs: 2.3 10*3/uL (ref 0.7–4.0)
MCH: 30 pg (ref 26.0–34.0)
MCHC: 34.9 g/dL (ref 30.0–36.0)
MCV: 86 fL (ref 78.0–100.0)
MONOS PCT: 7 % (ref 3–12)
Monocytes Absolute: 0.4 10*3/uL (ref 0.1–1.0)
Neutro Abs: 3.2 10*3/uL (ref 1.7–7.7)
Neutrophils Relative %: 52 % (ref 43–77)
Platelets: 275 10*3/uL (ref 150–400)
RBC: 4.5 MIL/uL (ref 3.87–5.11)
RDW: 12.3 % (ref 11.5–15.5)
WBC: 6.1 10*3/uL (ref 4.0–10.5)

## 2014-05-19 LAB — LIPID PANEL
CHOL/HDL RATIO: 3.4 ratio
CHOLESTEROL: 182 mg/dL (ref 0–200)
HDL: 54 mg/dL (ref >39)
LDL Cholesterol: 105 mg/dL — ABNORMAL HIGH (ref 0–99)
Triglycerides: 114 mg/dL (ref <150)
VLDL: 23 mg/dL (ref 0–40)

## 2014-05-19 LAB — TSH: TSH: 2.353 u[IU]/mL (ref 0.350–4.500)

## 2014-05-19 NOTE — Patient Instructions (Signed)
Influenza Virus Vaccine injection (Fluarix) What is this medicine? INFLUENZA VIRUS VACCINE (in floo EN zuh VAHY ruhs vak SEEN) helps to reduce the risk of getting influenza also known as the flu. This medicine may be used for other purposes; ask your health care provider or pharmacist if you have questions. COMMON BRAND NAME(S): Fluarix, Fluzone What should I tell my health care provider before I take this medicine? They need to know if you have any of these conditions: -bleeding disorder like hemophilia -fever or infection -Guillain-Barre syndrome or other neurological problems -immune system problems -infection with the human immunodeficiency virus (HIV) or AIDS -low blood platelet counts -multiple sclerosis -an unusual or allergic reaction to influenza virus vaccine, eggs, chicken proteins, latex, gentamicin, other medicines, foods, dyes or preservatives -pregnant or trying to get pregnant -breast-feeding How should I use this medicine? This vaccine is for injection into a muscle. It is given by a health care professional. A copy of Vaccine Information Statements will be given before each vaccination. Read this sheet carefully each time. The sheet may change frequently. Talk to your pediatrician regarding the use of this medicine in children. Special care may be needed. Overdosage: If you think you have taken too much of this medicine contact a poison control center or emergency room at once. NOTE: This medicine is only for you. Do not share this medicine with others. What if I miss a dose? This does not apply. What may interact with this medicine? -chemotherapy or radiation therapy -medicines that lower your immune system like etanercept, anakinra, infliximab, and adalimumab -medicines that treat or prevent blood clots like warfarin -phenytoin -steroid medicines like prednisone or cortisone -theophylline -vaccines This list may not describe all possible interactions. Give your  health care provider a list of all the medicines, herbs, non-prescription drugs, or dietary supplements you use. Also tell them if you smoke, drink alcohol, or use illegal drugs. Some items may interact with your medicine. What should I watch for while using this medicine? Report any side effects that do not go away within 3 days to your doctor or health care professional. Call your health care provider if any unusual symptoms occur within 6 weeks of receiving this vaccine. You may still catch the flu, but the illness is not usually as bad. You cannot get the flu from the vaccine. The vaccine will not protect against colds or other illnesses that may cause fever. The vaccine is needed every year. What side effects may I notice from receiving this medicine? Side effects that you should report to your doctor or health care professional as soon as possible: -allergic reactions like skin rash, itching or hives, swelling of the face, lips, or tongue Side effects that usually do not require medical attention (report to your doctor or health care professional if they continue or are bothersome): -fever -headache -muscle aches and pains -pain, tenderness, redness, or swelling at site where injected -weak or tired This list may not describe all possible side effects. Call your doctor for medical advice about side effects. You may report side effects to FDA at 1-800-FDA-1088. Where should I keep my medicine? This vaccine is only given in a clinic, pharmacy, doctor's office, or other health care setting and will not be stored at home. NOTE: This sheet is a summary. It may not cover all possible information. If you have questions about this medicine, talk to your doctor, pharmacist, or health care provider.  2015, Elsevier/Gold Standard. (2008-02-06 09:30:40) Endometrial Biopsy Endometrial biopsy is  a procedure in which a tissue sample is taken from inside the uterus. The tissue sample is then looked at  under a microscope to see if the tissue is normal or abnormal. The endometrium is the lining of the uterus. This procedure helps determine where you are in your menstrual cycle and how hormone levels are affecting the lining of the uterus. This procedure may also be used to evaluate uterine bleeding or to diagnose endometrial cancer, tuberculosis, polyps, or inflammatory conditions.  LET [facility] CARE PROVIDER KNOW ABOUT:  Any allergies you have.  All medicines you are taking, including vitamins, herbs, eye drops, creams, and over-the-counter medicines.  Previous problems you or members of your family have had with the use of anesthetics.  Any blood disorders you have.  Previous surgeries you have had.  Medical conditions you have.  Possibility of pregnancy. RISKS AND COMPLICATIONS Generally, this is a safe procedure. However, as with any procedure, complications can occur. Possible complications include:  Bleeding.  Pelvic infection.  Puncture of the uterine wall with the biopsy device (rare). BEFORE THE PROCEDURE   Keep a record of your menstrual cycles as directed by your health care provider. You may need to schedule your procedure for a specific time in your cycle.  You may want to bring a sanitary pad to wear home after the procedure.  Arrange for someone to drive you home after the procedure if you will be given a medicine to help you relax (sedative). PROCEDURE   You may be given a sedative to relax you.  You will lie on an exam table with your feet and legs supported as in a pelvic exam.  Your health care provider will insert an instrument (speculum) into your vagina to see your cervix.  Your cervix will be cleansed with an antiseptic solution. A medicine (local anesthetic) will be used to numb the cervix.  A forceps instrument (tenaculum) will be used to hold your cervix steady for the biopsy.  A thin, rodlike instrument (uterine sound) will be inserted  through your cervix to determine the length of your uterus and the location where the biopsy sample will be removed.  A thin, flexible tube (catheter) will be inserted through your cervix and into the uterus. The catheter is used to collect the biopsy sample from your endometrial tissue.  The catheter and speculum will then be removed, and the tissue sample will be sent to a lab for examination. AFTER THE PROCEDURE  You will rest in a recovery area until you are ready to go home.  You may have mild cramping and a small amount of vaginal bleeding for a few days after the procedure. This is normal.  Make sure you find out how to get your test results. Document Released: 11/11/2004 Document Revised: 03/13/2013 Document Reviewed: 12/26/2012 [facility] Patient Information 2015 [place], [place]. This information is not intended to replace advice given to you by your health care provider. Make sure you discuss any questions you have with your health care provider.

## 2014-05-19 NOTE — Progress Notes (Signed)
   Patient is a 58 year old that presented to the office stating that for the past 2 weeks she has noted some spotting. She is on hormonal replacement therapy consisting of transdermal estrogen (Elestrin 0.06%) which she applies to one arm daily with the addition of Prometrium 200 mg for 12 days of the month. She reports good compliance. Patient was seen in the office in June of this year and had a normal Pap smear. Many years ago she had a LEEP cervical conization at a different facility but the degree of dysplasia not known.  Patient was counseled as part of her evaluation for postmenopausal bleeding to undergo an endometrial biopsy. Her cervix was cleansed with Betadine solution after pelvic examination had demonstrated a uterus normal size shape and consistency and anteverted with no palpable masses or tenderness. A single-tooth tenaculum was placed on the anterior cervical lip. A rubber dilator was required to dilate her stenotic os to facilitate insertion of the P[place]elle. Very little tissue was obtained but was submitted for histological evaluation.  Assessment/plan: Patient with postmenopausal bleeding on HRT. Patient was bleeding at other times different than from the time that she was taking the Prometrium. Endometrial biopsy done today result pending at time of this dictation. Patient will be scheduled for sonohysterogram next week to better assess the uterine cavity to rule out endometrial polyp or submucous myoma. Patient received the flu vaccine today.

## 2014-05-20 LAB — URINALYSIS W MICROSCOPIC + REFLEX CULTURE
BILIRUBIN URINE: NEGATIVE
Casts: NONE SEEN
Crystals: NONE SEEN
Glucose, UA: NEGATIVE mg/dL
HGB URINE DIPSTICK: NEGATIVE
Ketones, ur: NEGATIVE mg/dL
Leukocytes, UA: NEGATIVE
Nitrite: NEGATIVE
Protein, ur: NEGATIVE mg/dL
Specific Gravity, Urine: 1.021 (ref 1.005–1.030)
Urobilinogen, UA: 0.2 mg/dL (ref 0.0–1.0)
pH: 5 (ref 5.0–8.0)

## 2014-05-21 LAB — URINE CULTURE
Colony Count: NO GROWTH
ORGANISM ID, BACTERIA: NO GROWTH

## 2014-05-23 ENCOUNTER — Telehealth: Payer: Self-pay | Admitting: *Deleted

## 2014-05-23 NOTE — Telephone Encounter (Signed)
Pt informed of labs from 05/19/14 visit [facility]

## 2014-05-26 ENCOUNTER — Encounter: Payer: Self-pay | Admitting: Gynecology

## 2014-05-29 ENCOUNTER — Ambulatory Visit (INDEPENDENT_AMBULATORY_CARE_PROVIDER_SITE_OTHER): Payer: BC Managed Care – PPO | Admitting: Gynecology

## 2014-05-29 ENCOUNTER — Ambulatory Visit (INDEPENDENT_AMBULATORY_CARE_PROVIDER_SITE_OTHER): Payer: BC Managed Care – PPO

## 2014-05-29 ENCOUNTER — Encounter: Payer: Self-pay | Admitting: Gynecology

## 2014-05-29 DIAGNOSIS — N83319 Acquired atrophy of ovary, unspecified side: Secondary | ICD-10-CM

## 2014-05-29 DIAGNOSIS — N840 Polyp of corpus uteri: Secondary | ICD-10-CM | POA: Insufficient documentation

## 2014-05-29 DIAGNOSIS — N950 Postmenopausal bleeding: Secondary | ICD-10-CM

## 2014-05-29 DIAGNOSIS — N8331 Acquired atrophy of ovary: Secondary | ICD-10-CM

## 2014-05-29 NOTE — Progress Notes (Signed)
   Patient presented to the office today for sonohysterogram as part of her evaluation for postmenopausal irregular bleeding. Her history is as follows:  Patient had stated that for 2 weeks she had notice some vaginal spotting.She is on hormonal replacement therapy consisting of transdermal estrogen (Elestrin 0.06%) which she applies to one arm daily with the addition of Prometrium 200 mg for 12 days of the month. She reports good compliance. Patient was seen in the office in June of this year and had a normal Pap smear. Many years ago she had a LEEP cervical conization at a different facility but the degree of dysplasia not known  Patient underwent an endometrial biopsy on the last office visit of 05/19/2014 with following result: Diagnosis Endometrium, biopsy, uterus - FRAGMENTS ENDOMETRIAL POLYP; NEGATIVE FOR ATYPIA OR MALIGNANCY. - SEPARATE FRAGMENTS OF PROLIFERATIVE PHASE ENDOMETRIUM WITH FEATURES OF GLANDULAR AND STROMAL BREAKDOWN; NEGATIVE FOR HYPERPLASIA OR MALIGNANCY. - BENIGN ENDOCERVICAL MUCOSA.  Pap smear June 2015 normal  Patient was counseled for sonohysterogram. The ultrasound today demonstrated the following: Uterus measured 8.8 x 5.8 x 5.1 cm with endometrial stripe of 14.1 mm cortical cystic area of prominent endometrium was noted. Patient is on hormone replacement therapy. Right and left ovary were normal. After the cervix was cleansed with Betadine solution the cervix required dilatation to facilitate insertion of a sterile catheter. Normal saline was instilled. No endometrial defect was noted. Anterior endometrial wall was 4.8 mm and posterior endometrial wall was 5.1 mm.  Assessment/plan: Patient was several days of spotting on a nonhormonal replacement therapy otherwise has done well. Negative endometrial biopsy with exception of a small polyp which may have been removed which could've been the problem for her spotting although none was seen on today sonohysterogram. Recent  normal Pap smear. Patient was reassured will continue to monitor if it reoccurs within the next 6 month she'll return back to the office for reevaluation.

## 2015-01-12 ENCOUNTER — Other Ambulatory Visit: Payer: Self-pay

## 2015-01-12 MED ORDER — PROGESTERONE MICRONIZED 200 MG PO CAPS: 200.0000 mg | ORAL_CAPSULE | Freq: Every day | ORAL | Status: DC

## 2015-02-10 ENCOUNTER — Other Ambulatory Visit: Payer: Self-pay

## 2015-02-10 MED ORDER — ESTRADIOL 0.52 MG/0.87 GM (0.06%) TD GEL: 1.0000 "application " | Freq: Every day | TRANSDERMAL | Status: DC

## 2015-04-14 ENCOUNTER — Other Ambulatory Visit: Payer: Self-pay

## 2015-04-16 ENCOUNTER — Other Ambulatory Visit: Payer: Self-pay

## 2015-04-20 MED ORDER — PROGESTERONE MICRONIZED 200 MG PO CAPS: 200.0000 mg | ORAL_CAPSULE | Freq: Every day | ORAL | Status: DC

## 2015-04-21 ENCOUNTER — Other Ambulatory Visit: Payer: Self-pay

## 2015-05-28 ENCOUNTER — Encounter: Payer: Self-pay | Admitting: Gynecology

## 2015-06-24 ENCOUNTER — Ambulatory Visit (INDEPENDENT_AMBULATORY_CARE_PROVIDER_SITE_OTHER): Payer: BLUE CROSS/BLUE SHIELD | Admitting: Gynecology

## 2015-06-24 ENCOUNTER — Other Ambulatory Visit (HOSPITAL_COMMUNITY)
Admission: RE | Admit: 2015-06-24 | Discharge: 2015-06-24 | Disposition: A | Discharge status: Home / Self Care | Payer: Self-pay | Source: Ambulatory Visit | Attending: Gynecology | Admitting: Gynecology

## 2015-06-24 ENCOUNTER — Encounter: Payer: Self-pay | Admitting: Gynecology

## 2015-06-24 VITALS — BP 148/94 | Ht 64.5 in | Wt 161.8 lb

## 2015-06-24 DIAGNOSIS — Z79890 Hormone replacement therapy (postmenopausal): Secondary | ICD-10-CM | POA: Diagnosis not present

## 2015-06-24 DIAGNOSIS — E038 Other specified hypothyroidism: Secondary | ICD-10-CM | POA: Insufficient documentation

## 2015-06-24 DIAGNOSIS — Z23 Encounter for immunization: Secondary | ICD-10-CM

## 2015-06-24 DIAGNOSIS — Z01419 Encounter for gynecological examination (general) (routine) without abnormal findings: Secondary | ICD-10-CM | POA: Diagnosis not present

## 2015-06-24 DIAGNOSIS — Z87410 Personal history of cervical dysplasia: Secondary | ICD-10-CM | POA: Diagnosis not present

## 2015-06-24 MED ORDER — ESTRADIOL 0.52 MG/0.87 GM (0.06%) TD GEL: 1.0000 "application " | Freq: Every day | TRANSDERMAL | Status: DC

## 2015-06-24 MED ORDER — PROGESTERONE MICRONIZED 200 MG PO CAPS: 200.0000 mg | ORAL_CAPSULE | Freq: Every day | ORAL | Status: DC

## 2015-06-24 NOTE — Progress Notes (Signed)
Kristie Hansen 1956-07-24 [id number]   History:    59 y.o.  for annual gyn exam with no complaints today. Patient is being followed by her psychiatrist in [place], [place] who has her on Adderall. 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 Review of her record indicated in 05/29/2014 she had some postmenopausal irregular bleeding while she had been on hormone replacement therapy which consisted of transdermal estrogen (Elestrin 0.06%) which she applies to one arm daily with the addition of Prometrium 200 mg for 12 days of the month. In October 2015 she underwent an endometrial biopsy and sonohysterogram and the following was noted:  Diagnosis Endometrium, biopsy, uterus - FRAGMENTS ENDOMETRIAL POLYP; NEGATIVE FOR ATYPIA OR MALIGNANCY. - SEPARATE FRAGMENTS OF PROLIFERATIVE PHASE ENDOMETRIUM WITH FEATURES OF GLANDULAR AND STROMAL BREAKDOWN; NEGATIVE FOR HYPERPLASIA OR MALIGNANCY. - BENIGN ENDOCERVICAL MUCOSA.  Pap smear June 2015 normal The ultrasound today demonstrated the following: Uterus measured 8.8 x 5.8 x 5.1 cm with endometrial stripe of 14.1 mm cortical cystic area of prominent endometrium was noted.   She has continued on hormonal replacement therapy reports no vaginal bleeding. Many years ago she had a LEEP cervical conization at a different facility for some form of dysplasia which we do not have the report for this reason we are continuing to do her Pap smears every year. She stated she had a normal colonoscopy about 5 years ago. Patient has not had her bone density study. She has not seen her primary care physician for over a year and needs her blood work.  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 Otho Bellows dermatologist for annual mole checks. She has a history of hypothyroidism where she is on 75 mcg of levothyroxin .  Past medical  history,surgical history, family history and social history were all reviewed and documented in the EPIC chart.  Gynecologic History No LMP recorded. Patient is postmenopausal. Contraception: post menopausal status Last Pap: 2015. Results were: normal Last mammogram: 2015. Results were: normal  Obstetric History OB History  Gravida Para Term Preterm AB SAB TAB Ectopic Multiple Living  2 2        2     # Outcome Date GA Lbr Len/2nd Weight Sex Delivery Anes PTL Lv  2 Para           1 Para                ROS: A ROS was performed and pertinent positives and negatives are included in the history.  GENERAL: No fevers or chills. HEENT: No change in vision, no earache, sore throat or sinus congestion. NECK: No pain or stiffness. CARDIOVASCULAR: No chest pain or pressure. No palpitations. PULMONARY: No shortness of breath, cough or wheeze. GASTROINTESTINAL: No abdominal pain, nausea, vomiting or diarrhea, melena or bright red blood per rectum. GENITOURINARY: No urinary frequency, urgency, hesitancy or dysuria. MUSCULOSKELETAL: No joint or muscle pain, no back pain, no recent trauma. DERMATOLOGIC: No rash, no itching, no lesions. ENDOCRINE: No polyuria, polydipsia, no heat or cold intolerance. No recent change in weight. HEMATOLOGICAL: No anemia or easy bruising or bleeding. NEUROLOGIC: No headache, seizures, numbness, tingling or weakness. PSYCHIATRIC: No depression, no loss of interest in normal activity or change in sleep pattern.     Exam: chaperone present  BP 148/94 mmHg  Ht 5' 4.5" (1.638 m)  Wt  161 lb 12.8 oz (73.392 kg)  BMI 27.35 kg/m2  Body mass index is 27.35 kg/(m^2).  General appearance : Well developed well nourished female. No acute distress HEENT: Eyes: no retinal hemorrhage or exudates,  Neck supple, trachea midline, no carotid bruits, no thyroidmegaly Lungs: Clear to auscultation, no rhonchi or wheezes, or rib retractions  Heart: Regular rate and rhythm, no murmurs or  gallops Breast:Examined in sitting and supine position were symmetrical in appearance, no palpable masses or tenderness,  no skin retraction, no nipple inversion, no nipple discharge, no skin discoloration, no axillary or supraclavicular lymphadenopathy Abdomen: no palpable masses or tenderness, no rebound or guarding Extremities: no edema or skin discoloration or tenderness  Pelvic:  Bartholin, Urethra, Skene Glands: Within normal limits             Vagina: No gross lesions or discharge  Cervix: No gross lesions or discharge  Uterus  anteverted, normal size, shape and consistency, non-tender and mobile  Adnexa  Without masses or tenderness  Anus and perineum  normal   Rectovaginal  normal sphincter tone without palpated masses or tenderness             Hemoccult cards provided     Assessment/Plan:  59 y.o. female for annual exam who is a recovering alcoholic has not had a drink in over 10 years. Patient been followed by her psychiatrist in [place], [place] for which she is being treated for for Adderall. Patient with history of hypothyroidism on levothyroxin we will check a full thyroid panel which she returns next week in a fasting state for the following screening blood work: Comprehensive metabolic panel, fasting lipid profile, CBC and urinalysis. She will also come that day for her baseline bone density study. She was reminded to schedule her mammogram. She is to contact her gastroenterologist who had recommended a colonoscopy in her fifth year which would be due next year. We discussed importance of calcium and vitamin D for osteoporosis prevention. Patient received the flu vaccine today.   Terrance Mass MD, 5:05 PM 06/24/2015

## 2015-06-24 NOTE — Addendum Note (Signed)
Addended by: Terrance Mass on: 06/24/2015 05:27 PM   Modules accepted: Orders

## 2015-06-24 NOTE — Patient Instructions (Addendum)
Bone Densitometry Bone densitometry is an imaging test that uses a special X-ray to measure the amount of calcium and other minerals in your bones (bone density). This test is also known as a bone mineral density test or dual-energy X-ray absorptiometry (DXA). The test can measure bone density at your hip and your spine. It is similar to having a regular X-ray. You may have this test to:  Diagnose a condition that causes weak or thin bones (osteoporosis).  Predict your risk of a broken bone (fracture).  Determine how well osteoporosis treatment is working. LET [facility] CARE PROVIDER KNOW ABOUT:  Any allergies you have.  All medicines you are taking, including vitamins, herbs, eye drops, creams, and over-the-counter medicines.  Previous problems you or members of your family have had with the use of anesthetics.  Any blood disorders you have.  Previous surgeries you have had.  Medical conditions you have.  Possibility of pregnancy.  Any other medical test you had within the previous 14 days that used contrast material. RISKS AND COMPLICATIONS Generally, this is a safe procedure. However, problems can occur and may include the following:  This test exposes you to a very small amount of radiation.  The risks of radiation exposure may be greater to unborn children. BEFORE THE PROCEDURE  Do not take any calcium supplements for 24 hours before having the test. You can otherwise eat and drink what you usually do.  Take off all metal jewelry, eyeglasses, dental appliances, and any other metal objects. PROCEDURE  You may lie on an exam table. There will be an X-ray generator below you and an imaging device above you.  Other devices, such as boxes or braces, may be used to position your body properly for the scan.  You will need to lie still while the machine slowly scans your body.  The images will show up on a computer monitor. AFTER THE PROCEDURE You may need more testing  at a later time.   This information is not intended to replace advice given to you by your health care provider. Make sure you discuss any questions you have with your health care provider.   Document Released: 08/02/2004 Document Revised: 08/01/2014 Document Reviewed: 12/19/2013 Elsevier Interactive Patient Education 2016 [employer]. Tdap Vaccine (Tetanus, Diphtheria and Pertussis): What You Need to Know 1. Why get vaccinated? Tetanus, diphtheria and pertussis are very serious diseases. Tdap vaccine can protect [place] from these diseases. And, Tdap vaccine given to pregnant women can protect newborn babies against pertussis. TETANUS (Lockjaw) is rare in the [place] today. It causes painful muscle tightening and stiffness, usually all over the body.  It can lead to tightening of muscles in the head and neck so you can't open your mouth, swallow, or sometimes even breathe. Tetanus kills about 1 out of 10 people who are infected even after receiving the best medical care. DIPHTHERIA is also rare in the [place] today. It can cause a thick coating to form in the back of the throat.  It can lead to breathing problems, heart failure, paralysis, and death. PERTUSSIS (Whooping Cough) causes severe coughing spells, which can cause difficulty breathing, vomiting and disturbed sleep.  It can also lead to weight loss, incontinence, and rib fractures. Up to 2 in 100 adolescents and 5 in 100 adults with pertussis are hospitalized or have complications, which could include pneumonia or death. These diseases are caused by bacteria. Diphtheria and pertussis are spread from person to person through secretions from  coughing or sneezing. Tetanus enters the body through cuts, scratches, or wounds. Before vaccines, as many as 200,000 cases of diphtheria, 200,000 cases of pertussis, and hundreds of cases of tetanus, were reported in the [place] each year. Since vaccination began, reports of cases for  tetanus and diphtheria have dropped by about 99% and for pertussis by about 80%. 2. Tdap vaccine Tdap vaccine can protect adolescents and adults from tetanus, diphtheria, and pertussis. One dose of Tdap is routinely given at age 49 or 28. People who did not get Tdap at that age should get it as soon as possible. Tdap is especially important for healthcare professionals and anyone having close contact with a baby younger than 12 months. Pregnant women should get a dose of Tdap during every pregnancy, to protect the newborn from pertussis. Infants are most at risk for severe, life-threatening complications from pertussis. Another vaccine, called Td, protects against tetanus and diphtheria, but not pertussis. A Td booster should be given every 10 years. Tdap may be given as one of these boosters if you have never gotten Tdap before. Tdap may also be given after a severe cut or burn to prevent tetanus infection. Your doctor or the person giving you the vaccine can give you more information. Tdap may safely be given at the same time as other vaccines. 3. Some people should not get this vaccine  A person who has ever had a life-threatening allergic reaction after a previous dose of any diphtheria, tetanus or pertussis containing vaccine, OR has a severe allergy to any part of this vaccine, should not get Tdap vaccine. Tell the person giving the vaccine about any severe allergies.  Anyone who had coma or long repeated seizures within 7 days after a childhood dose of DTP or DTaP, or a previous dose of Tdap, should not get Tdap, unless a cause other than the vaccine was found. They can still get Td.  Talk to your doctor if you:  have seizures or another nervous system problem,  had severe pain or swelling after any vaccine containing diphtheria, tetanus or pertussis,  ever had a condition called Guillain-Barr Syndrome (GBS),  aren't feeling well on the day the shot is scheduled. 4. Risks With any  medicine, including vaccines, there is a chance of side effects. These are usually mild and go away on their own. Serious reactions are also possible but are rare. Most people who get Tdap vaccine do not have any problems with it. Mild problems following Tdap (Did not interfere with activities)  Pain where the shot was given (about 3 in 4 adolescents or 2 in 3 adults)  Redness or swelling where the shot was given (about 1 person in 5)  Mild fever of at least 100.57F (up to about 1 in 25 adolescents or 1 in 100 adults)  Headache (about 3 or 4 people in 10)  Tiredness (about 1 person in 3 or 4)  Nausea, vomiting, diarrhea, stomach ache (up to 1 in 4 adolescents or 1 in 10 adults)  Chills, sore joints (about 1 person in 10)  Body aches (about 1 person in 3 or 4)  Rash, swollen glands (uncommon) Moderate problems following Tdap (Interfered with activities, but did not require medical attention)  Pain where the shot was given (up to 1 in 5 or 6)  Redness or swelling where the shot was given (up to about 1 in 16 adolescents or 1 in 12 adults)  Fever over 102F (about 1 in 100 adolescents  or 1 in 250 adults)  Headache (about 1 in 7 adolescents or 1 in 10 adults)  Nausea, vomiting, diarrhea, stomach ache (up to 1 or 3 people in 100)  Swelling of the entire arm where the shot was given (up to about 1 in 500). Severe problems following Tdap (Unable to perform usual activities; required medical attention)  Swelling, severe pain, bleeding and redness in the arm where the shot was given (rare). Problems that could happen after any vaccine:  People sometimes faint after a medical procedure, including vaccination. Sitting or lying down for about 15 minutes can help prevent fainting, and injuries caused by a fall. Tell your doctor if you feel dizzy, or have vision changes or ringing in the ears.  Some people get severe pain in the shoulder and have difficulty moving the arm where a shot  was given. This happens very rarely.  Any medication can cause a severe allergic reaction. Such reactions from a vaccine are very rare, estimated at fewer than 1 in a million doses, and would happen within a few minutes to a few hours after the vaccination. As with any medicine, there is a very remote chance of a vaccine causing a serious injury or death. The safety of vaccines is always being monitored. For more information, visit: http://www.aguilar.org/ 5. What if there is a serious problem? What should I look for?  Look for anything that concerns you, such as signs of a severe allergic reaction, very high fever, or unusual behavior.  Signs of a severe allergic reaction can include hives, swelling of the face and throat, difficulty breathing, a fast heartbeat, dizziness, and weakness. These would usually start a few minutes to a few hours after the vaccination. What should I do?  If you think it is a severe allergic reaction or other emergency that can't wait, call 9-1-1 or get the person to the nearest hospital. Otherwise, call your doctor.  Afterward, the reaction should be reported to the Vaccine Adverse Event Reporting System (VAERS). Your doctor might file this report, or you can do it yourself through the VAERS web site at www.vaers.SamedayNews.es, or by calling (517)150-0313. VAERS does not give medical advice.  6. The National Vaccine Injury Compensation Program The [employer] Vaccine Injury Compensation Program (VICP) is a federal program that was created to compensate people who may have been injured by certain vaccines. Persons who believe they may have been injured by a vaccine can learn about the program and about filing a claim by calling 325-336-8857 or visiting the [place] website at GoldCloset.com.ee. There is a time limit to file a claim for compensation. 7. How can I learn more?  Ask your doctor. He or she can give you the vaccine package insert or suggest other  sources of information.  Call your local or state health department.  Contact the Centers for Disease Control and Prevention (CDC):  Call 564-271-3381 (1-800-CDC-INFO) or  Visit CDC's website at http://hunter.com/ CDC Tdap Vaccine VIS (09/17/13)   This information is not intended to replace advice given to you by your health care provider. Make sure you discuss any questions you have with your health care provider.   Document Released: 01/10/2012 Document Revised: 08/01/2014 Document Reviewed: 10/23/2013 Elsevier Interactive Patient Education [employer].

## 2015-06-24 NOTE — Addendum Note (Signed)
Addended by: Alen Blew on: 06/24/2015 05:31 PM   Modules accepted: Orders

## 2015-06-25 LAB — URINALYSIS W MICROSCOPIC + REFLEX CULTURE
BILIRUBIN URINE: NEGATIVE
CASTS: NONE SEEN [LPF]
Crystals: NONE SEEN [HPF]
Glucose, UA: NEGATIVE
Hgb urine dipstick: NEGATIVE
KETONES UR: NEGATIVE
Leukocytes, UA: NEGATIVE
NITRITE: NEGATIVE
PH: 5.5 (ref 5.0–8.0)
Protein, ur: NEGATIVE
RBC / HPF: NONE SEEN RBC/HPF (ref <=2)
SPECIFIC GRAVITY, URINE: 1.012 (ref 1.001–1.035)
Yeast: NONE SEEN [HPF]

## 2015-06-26 LAB — CYTOLOGY - PAP

## 2015-06-26 LAB — URINE CULTURE
Colony Count: NO GROWTH
ORGANISM ID, BACTERIA: NO GROWTH

## 2015-07-02 ENCOUNTER — Other Ambulatory Visit: Payer: BLUE CROSS/BLUE SHIELD

## 2016-03-01 ENCOUNTER — Telehealth: Payer: Self-pay

## 2016-03-01 MED ORDER — HYDROCORTISONE 2.5 % RE CREA: 1.0000 "application " | TOPICAL_CREAM | Freq: Two times a day (BID) | RECTAL | 3 refills | Status: DC

## 2016-03-01 NOTE — Telephone Encounter (Signed)
Rx sent 

## 2016-03-01 NOTE — Telephone Encounter (Signed)
Yes thank you this will be fine with 3 refills

## 2016-03-01 NOTE — Telephone Encounter (Signed)
Pharmacy contacted you because patient has Rx for [facility] Rectal Supp that was originally prescribed by a Dr. Wynelle Fanny who she no longer sees.  The suppositiories are no longer covered by her ins co and she asked pharmacist to contact you.  She would like to have them fill Proctozone HC 2.5% rectal cream 30 gram tube if okay with you.   If ok, any refills? CE due in Nov.

## 2016-03-07 ENCOUNTER — Encounter: Payer: Self-pay | Admitting: Gynecology

## 2016-03-07 ENCOUNTER — Ambulatory Visit (INDEPENDENT_AMBULATORY_CARE_PROVIDER_SITE_OTHER): Payer: BLUE CROSS/BLUE SHIELD | Admitting: Gynecology

## 2016-03-07 VITALS — BP 120/70

## 2016-03-07 DIAGNOSIS — K5900 Constipation, unspecified: Secondary | ICD-10-CM | POA: Insufficient documentation

## 2016-03-07 DIAGNOSIS — K645 Perianal venous thrombosis: Secondary | ICD-10-CM | POA: Diagnosis not present

## 2016-03-07 NOTE — Patient Instructions (Signed)

## 2016-03-07 NOTE — Progress Notes (Signed)
   HPI: Patient is a 60 year old that over the past few days has been complaining of worsening hemorrhoids. Patient also suffers from constipation that she needs to take laxative she goes to the bathroom every 3 days or so. She denies any hematochezia. No weight loss. No change in appetite. No GU complaints. Patient stated approximately 10 years ago she had hemorrhoidectomy as well. She has tried over-the-counter medications without any resolution to her symptoms.   ROS: A ROS was performed and pertinent positives and negatives are included in the history.  GENERAL: No fevers or chills. HEENT: No change in vision, no earache, sore throat or sinus congestion. NECK: No pain or stiffness. CARDIOVASCULAR: No chest pain or pressure. No palpitations. PULMONARY: No shortness of breath, cough or wheeze. GASTROINTESTINAL: No abdominal pain, nausea, vomiting or diarrhea, melena or bright red blood per rectum. GENITOURINARY: No urinary frequency, urgency, hesitancy or dysuria. MUSCULOSKELETAL: No joint or muscle pain, no back pain, no recent trauma. DERMATOLOGIC: No rash, no itching, no lesions. ENDOCRINE: No polyuria, polydipsia, no heat or cold intolerance. No recent change in weight. HEMATOLOGICAL: No anemia or easy bruising or bleeding. NEUROLOGIC: No headache, seizures, numbness, tingling or weakness. PSYCHIATRIC: No depression, no loss of interest in normal activity or change in sleep pattern.   PE: blood pressure 120/70 Gen. Appearance well developed well nourished female with the above-mentioned complaint Pelvic: Bartholin urethra Skene was within normal limits rectal exam: Patient with 2 internal hemorrhoids the largest one is thrombosed and tender  Assessment Plan: thrombosed internal hemorrhoid will be referred to general surgeon today for [place].    Greater than 50% of time was spent in counseling and [id number] care of this patient.   Time of consultation: 15    Minutes.

## 2016-04-27 ENCOUNTER — Other Ambulatory Visit: Payer: Self-pay | Admitting: Gynecology

## 2016-04-27 DIAGNOSIS — Z1231 Encounter for screening mammogram for malignant neoplasm of breast: Secondary | ICD-10-CM

## 2016-05-09 ENCOUNTER — Ambulatory Visit (HOSPITAL_COMMUNITY): Payer: Self-pay

## 2016-05-09 ENCOUNTER — Ambulatory Visit (HOSPITAL_COMMUNITY)
Admission: RE | Admit: 2016-05-09 | Discharge: 2016-05-09 | Disposition: A | Discharge status: Home / Self Care | Payer: BLUE CROSS/BLUE SHIELD | Source: Ambulatory Visit | Attending: Gynecology | Admitting: Gynecology

## 2016-05-09 DIAGNOSIS — Z1231 Encounter for screening mammogram for malignant neoplasm of breast: Secondary | ICD-10-CM | POA: Insufficient documentation

## 2016-05-09 DIAGNOSIS — Z780 Asymptomatic menopausal state: Secondary | ICD-10-CM | POA: Insufficient documentation

## 2016-05-09 DIAGNOSIS — Z79890 Hormone replacement therapy (postmenopausal): Secondary | ICD-10-CM

## 2016-05-09 DIAGNOSIS — Z79899 Other long term (current) drug therapy: Secondary | ICD-10-CM | POA: Diagnosis not present

## 2016-06-23 ENCOUNTER — Other Ambulatory Visit: Payer: Self-pay | Admitting: Gynecology

## 2016-07-01 ENCOUNTER — Other Ambulatory Visit: Payer: Self-pay | Admitting: Gynecology

## 2016-08-03 ENCOUNTER — Other Ambulatory Visit: Payer: Self-pay | Admitting: Gynecology

## 2016-08-23 ENCOUNTER — Other Ambulatory Visit: Payer: Self-pay | Admitting: Gynecology

## 2016-08-24 MED ORDER — ESTRADIOL 0.75 MG/1.25 GM (0.06%) TD GEL: TRANSDERMAL | 0 refills | Status: DC

## 2016-08-24 NOTE — Telephone Encounter (Signed)
Annual 08/29/16

## 2016-08-29 ENCOUNTER — Encounter: Payer: Self-pay | Admitting: Gynecology

## 2016-08-29 ENCOUNTER — Ambulatory Visit (INDEPENDENT_AMBULATORY_CARE_PROVIDER_SITE_OTHER): Payer: BLUE CROSS/BLUE SHIELD | Admitting: Gynecology

## 2016-08-29 VITALS — BP 158/80 | Ht 64.0 in | Wt 160.0 lb

## 2016-08-29 DIAGNOSIS — Z79890 Hormone replacement therapy (postmenopausal): Secondary | ICD-10-CM

## 2016-08-29 DIAGNOSIS — Z87410 Personal history of cervical dysplasia: Secondary | ICD-10-CM

## 2016-08-29 DIAGNOSIS — E038 Other specified hypothyroidism: Secondary | ICD-10-CM | POA: Diagnosis not present

## 2016-08-29 DIAGNOSIS — Z01419 Encounter for gynecological examination (general) (routine) without abnormal findings: Secondary | ICD-10-CM

## 2016-08-29 LAB — COMPREHENSIVE METABOLIC PANEL
ALK PHOS: 64 U/L (ref 33–130)
ALT: 15 U/L (ref 6–29)
AST: 18 U/L (ref 10–35)
Albumin: 4.2 g/dL (ref 3.6–5.1)
BILIRUBIN TOTAL: 0.6 mg/dL (ref 0.2–1.2)
BUN: 10 mg/dL (ref 7–25)
CALCIUM: 9.4 mg/dL (ref 8.6–10.4)
CO2: 24 mmol/L (ref 20–31)
CREATININE: 0.76 mg/dL (ref 0.50–1.05)
Chloride: 101 mmol/L (ref 98–110)
Glucose, Bld: 86 mg/dL (ref 65–99)
Potassium: 4.7 mmol/L (ref 3.5–5.3)
SODIUM: 136 mmol/L (ref 135–146)
Total Protein: 7.4 g/dL (ref 6.1–8.1)

## 2016-08-29 LAB — CBC WITH DIFFERENTIAL/PLATELET
BASOS PCT: 1 %
Basophils Absolute: 56 cells/uL (ref 0–200)
Eosinophils Absolute: 112 cells/uL (ref 15–500)
Eosinophils Relative: 2 %
HEMATOCRIT: 40.2 % (ref 35.0–45.0)
Hemoglobin: 13.5 g/dL (ref 11.7–15.5)
LYMPHS ABS: 2128 {cells}/uL (ref 850–3900)
LYMPHS PCT: 38 %
MCH: 29.8 pg (ref 27.0–33.0)
MCHC: 33.6 g/dL (ref 32.0–36.0)
MCV: 88.7 fL (ref 80.0–100.0)
MONO ABS: 504 {cells}/uL (ref 200–950)
MPV: 9.8 fL (ref 7.5–12.5)
Monocytes Relative: 9 %
Neutro Abs: 2800 cells/uL (ref 1500–7800)
Neutrophils Relative %: 50 %
Platelets: 230 10*3/uL (ref 140–400)
RBC: 4.53 MIL/uL (ref 3.80–5.10)
RDW: 13.3 % (ref 11.0–15.0)
WBC: 5.6 10*3/uL (ref 3.8–10.8)

## 2016-08-29 LAB — TSH: TSH: 1.36 m[IU]/L

## 2016-08-29 LAB — LIPID PANEL
Cholesterol: 186 mg/dL (ref <200)
HDL: 69 mg/dL (ref >50)
LDL Cholesterol: 103 mg/dL — ABNORMAL HIGH (ref <100)
Total CHOL/HDL Ratio: 2.7 Ratio (ref <5.0)
Triglycerides: 69 mg/dL (ref <150)
VLDL: 14 mg/dL (ref <30)

## 2016-08-29 MED ORDER — LEVOTHYROXINE SODIUM 75 MCG PO TABS: 75.0000 ug | ORAL_TABLET | Freq: Every day | ORAL | 11 refills | Status: DC

## 2016-08-29 MED ORDER — ESTRADIOL 0.52 MG/0.87 GM (0.06%) TD GEL: 1.0000 "application " | Freq: Every day | TRANSDERMAL | 5 refills | Status: DC

## 2016-08-29 MED ORDER — PROGESTERONE MICRONIZED 200 MG PO CAPS: 200.0000 mg | ORAL_CAPSULE | Freq: Every day | ORAL | 5 refills | Status: DC

## 2016-08-29 NOTE — Progress Notes (Signed)
Kristie Hansen 09/10/1955 [id number]   History:    61 y.o.  for annual gyn exam with no complaints today. She is fasting for her blood work today.Patient is being followed by her psychiatrist in [place], [place] who has her on Adderall. 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 Review of her record indicated in 05/29/2014 she had some postmenopausal irregular bleeding while she had been on hormone replacement therapy which consisted of transdermal estrogen (Elestrin 0.06%) which she applies to one arm daily with the addition of Prometrium 200 mg for 12 days of the month. In October 2015 she underwent an endometrial biopsy and sonohysterogram and the following was noted:  Diagnosis Endometrium, biopsy, uterus - FRAGMENTS ENDOMETRIAL POLYP; NEGATIVE FOR ATYPIA OR MALIGNANCY. - SEPARATE FRAGMENTS OF PROLIFERATIVE PHASE ENDOMETRIUM WITH FEATURES OF GLANDULAR AND STROMAL BREAKDOWN; NEGATIVE FOR HYPERPLASIA OR MALIGNANCY. - BENIGN ENDOCERVICAL MUCOSA.  Pap smear June 2015 normal The ultrasound today demonstrated the following: Uterus measured 8.8 x 5.8 x 5.1 cm with endometrial stripe of 14.1 mm cortical cystic area of prominent endometrium was noted.   She has continued on hormonal replacement therapy reports no vaginal bleeding. Many years ago she had a LEEP cervical conization at a different facility for some form of dysplasia which we do not have the report for this reason we are continuing to do her Pap smears every year. She stated she had a normal colonoscopy about 6 years ago. Patient had a normal bone density study in 2017.  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 Otho Bellows dermatologist for annual mole checks. She has a history of hypothyroidism where she is on 75 mcg of levothyroxin .   Past medical history,surgical history, family history and  social history were all reviewed and documented in the EPIC chart.  Gynecologic History No LMP recorded. Patient is postmenopausal. Contraception: post menopausal status Last Pap: 2016. Results were: normal Last mammogram: 2017. Results were: normal  Obstetric History OB History  Gravida Para Term Preterm AB Living  2 2       2   SAB TAB Ectopic Multiple Live Births               # Outcome Date GA Lbr Len/2nd Weight Sex Delivery Anes PTL Lv  2 Para           1 Para                ROS: A ROS was performed and pertinent positives and negatives are included in the history.  GENERAL: No fevers or chills. HEENT: No change in vision, no earache, sore throat or sinus congestion. NECK: No pain or stiffness. CARDIOVASCULAR: No chest pain or pressure. No palpitations. PULMONARY: No shortness of breath, cough or wheeze. GASTROINTESTINAL: No abdominal pain, nausea, vomiting or diarrhea, melena or bright red blood per rectum. GENITOURINARY: No urinary frequency, urgency, hesitancy or dysuria. MUSCULOSKELETAL: No joint or muscle pain, no back pain, no recent trauma. DERMATOLOGIC: No rash, no itching, no lesions. ENDOCRINE: No polyuria, polydipsia, no heat or cold intolerance. No recent change in weight. HEMATOLOGICAL: No anemia or easy bruising or bleeding. NEUROLOGIC: No headache, seizures, numbness, tingling or weakness. PSYCHIATRIC: No depression, no loss of interest in normal activity or change in sleep pattern.     Exam: chaperone present  BP (!) 158/80   Ht  5\' 4"  (1.626 m)   Wt 160 lb (72.6 kg)   BMI 27.46 kg/m   Body mass index is 27.46 kg/m.  General appearance : Well developed well nourished female. No acute distress HEENT: Eyes: no retinal hemorrhage or exudates,  Neck supple, trachea midline, no carotid bruits, no thyroidmegaly Lungs: Clear to auscultation, no rhonchi or wheezes, or rib retractions  Heart: Regular rate and rhythm, no murmurs or gallops Breast:Examined in sitting  and supine position were symmetrical in appearance, no palpable masses or tenderness,  no skin retraction, no nipple inversion, no nipple discharge, no skin discoloration, no axillary or supraclavicular lymphadenopathy Abdomen: no palpable masses or tenderness, no rebound or guarding Extremities: no edema or skin discoloration or tenderness  Pelvic:  Bartholin, Urethra, Skene Glands: Within normal limits             Vagina: No gross lesions or discharge  Cervix: No gross lesions or discharge  Uterus  anteverted, normal size, shape and consistency, non-tender and mobile  Adnexa  Without masses or tenderness  Anus and perineum  normal   Rectovaginal  normal sphincter tone without palpated masses or tenderness             Hemoccult fecal Hemoccult cards will be provided     Assessment/Plan:  61 y.o. female for annual exam who is a recovering alcoholic has not had a drink in over 10 years. Patient been followed by her psychiatrist in [place], [place] for which she is being treated for for Adderall. Patient with history of hypothyroidism on levothyroxin . The following fasting blood work was ordered today: Comprehensive metabolic panel, fasting lipid profile, CBC, TSH, and urinalysis. Because of her dysplasia history and cervical conization the past a Pap smear will be done today as well. She was reminded schedule her mammogram at the end of the year. Prescription refill for her hormone replacement therapy as well as her Synthroid was provided. She was reminded of the importance of monthly breast exams. We also discussed importance of calcium vitamin D and weightbearing exercises for osteoporosis prevention.   Terrance Mass MD, 12:04 PM 08/29/2016

## 2016-08-30 LAB — URINALYSIS W MICROSCOPIC + REFLEX CULTURE
Bacteria, UA: NONE SEEN [HPF]
Bilirubin Urine: NEGATIVE
Crystals: NONE SEEN [HPF]
GLUCOSE, UA: NEGATIVE
Hgb urine dipstick: NEGATIVE
Ketones, ur: NEGATIVE
LEUKOCYTES UA: NEGATIVE
NITRITE: NEGATIVE
PH: 6 (ref 5.0–8.0)
Protein, ur: NEGATIVE
RBC / HPF: NONE SEEN RBC/HPF (ref <=2)
SPECIFIC GRAVITY, URINE: 1.015 (ref 1.001–1.035)
YEAST: NONE SEEN [HPF]

## 2016-08-30 LAB — PAP IG W/ RFLX HPV ASCU

## 2016-08-31 LAB — URINE CULTURE: Organism ID, Bacteria: NO GROWTH

## 2016-12-07 ENCOUNTER — Encounter: Payer: Self-pay | Admitting: Gynecology

## 2017-04-11 ENCOUNTER — Ambulatory Visit: Payer: BLUE CROSS/BLUE SHIELD | Admitting: Women's Health

## 2017-04-12 ENCOUNTER — Encounter: Payer: Self-pay | Admitting: Women's Health

## 2017-04-12 ENCOUNTER — Ambulatory Visit (INDEPENDENT_AMBULATORY_CARE_PROVIDER_SITE_OTHER): Payer: BLUE CROSS/BLUE SHIELD | Admitting: Women's Health

## 2017-04-12 VITALS — BP 140/80 | Ht 64.0 in | Wt 160.0 lb

## 2017-04-12 DIAGNOSIS — N644 Mastodynia: Secondary | ICD-10-CM

## 2017-04-12 NOTE — Progress Notes (Signed)
Presents with bilateral axilla tenderness for several days. 61yo G2P2, Hx of axillary/breast tenderness in 2015- [place] of bilateral axilla (11/2015) not concerning for malignancy; showed "scattered areas of fibroglandular density". One of her four sisters was diagnosed with breast cancer at age 80 and recently same sister has recurrence. Unsure of BRCA testing. Denies any nipple discharge or recent trauma/injury to the area. Post menopause; on HRT since 2014. Last mammogram normal (04/2016)  Hypothyroidism and melanoma (toes)- routinely screened by derm.  Exam: Both breasts examined sitting/supine position; symmetrical in appearance. No discoloration, nipple inversion, retraction, dimpling, or masses visible. No palpable masses. Breast exam normal. No cervical, supraclavicular or axillary lymphadenopathy noted.  Bilateral axillary/breast tenderness sister with breast cancer  Plan:  Diagnostic mammography . Prefers to go to [facility] for imaging if possible. Will ask sister about BRCA testing.

## 2017-04-12 NOTE — Patient Instructions (Signed)

## 2017-04-13 ENCOUNTER — Telehealth: Payer: Self-pay | Admitting: *Deleted

## 2017-04-13 DIAGNOSIS — M79622 Pain in left upper arm: Secondary | ICD-10-CM

## 2017-04-13 DIAGNOSIS — M79621 Pain in right upper arm: Secondary | ICD-10-CM

## 2017-04-13 NOTE — Telephone Encounter (Signed)
-----   Message from Huel Cote, NP sent at 04/12/2017 10:34 AM EDT ----- Needs bilateral diag mammogram  Has axilla tenderness, no palpable masses, she felt something, would like to go to [place] if possible,

## 2017-04-13 NOTE — Telephone Encounter (Signed)
Appointment 04/18/17 @ 3:10pm at [facility], pt aware.

## 2017-04-18 ENCOUNTER — Encounter: Payer: Self-pay | Admitting: Women's Health

## 2017-04-18 ENCOUNTER — Ambulatory Visit (HOSPITAL_COMMUNITY)
Admission: RE | Admit: 2017-04-18 | Discharge: 2017-04-18 | Disposition: A | Discharge status: Home / Self Care | Payer: BLUE CROSS/BLUE SHIELD | Source: Ambulatory Visit | Attending: Women's Health | Admitting: Women's Health

## 2017-04-18 DIAGNOSIS — M79622 Pain in left upper arm: Secondary | ICD-10-CM | POA: Insufficient documentation

## 2017-04-18 DIAGNOSIS — M79621 Pain in right upper arm: Secondary | ICD-10-CM

## 2017-05-10 ENCOUNTER — Other Ambulatory Visit: Payer: Self-pay | Admitting: *Deleted

## 2017-05-10 MED ORDER — ESTRADIOL 0.52 MG/0.87 GM (0.06%) TD GEL: 1.0000 "application " | Freq: Every day | TRANSDERMAL | 5 refills | Status: DC

## 2017-09-04 ENCOUNTER — Other Ambulatory Visit: Payer: Self-pay

## 2017-11-28 ENCOUNTER — Other Ambulatory Visit: Payer: Self-pay | Admitting: Women's Health

## 2018-01-23 ENCOUNTER — Other Ambulatory Visit: Payer: Self-pay | Admitting: Women's Health

## 2018-03-01 ENCOUNTER — Other Ambulatory Visit: Payer: Self-pay

## 2018-03-02 ENCOUNTER — Other Ambulatory Visit: Payer: Self-pay | Admitting: Women's Health

## 2018-03-05 ENCOUNTER — Telehealth: Payer: Self-pay | Admitting: *Deleted

## 2018-03-05 ENCOUNTER — Other Ambulatory Visit: Payer: Self-pay

## 2018-03-05 MED ORDER — ESTRADIOL 0.75 MG/1.25 GM (0.06%) TD GEL: TRANSDERMAL | 2 refills | Status: DC

## 2018-03-05 MED ORDER — LEVOTHYROXINE SODIUM 75 MCG PO TABS: 75.0000 ug | ORAL_TABLET | Freq: Every day | ORAL | 0 refills | Status: DC

## 2018-03-05 MED ORDER — ESTRADIOL 0.52 MG/0.87 GM (0.06%) TD GEL: 1.0000 "application " | Freq: Every day | TRANSDERMAL | 2 refills | Status: DC

## 2018-03-05 MED ORDER — PROGESTERONE MICRONIZED 200 MG PO CAPS: 200.0000 mg | ORAL_CAPSULE | Freq: Every day | ORAL | 0 refills | Status: DC

## 2018-03-05 NOTE — Telephone Encounter (Signed)
Patient annual exam scheduled on 04/18/18 needs refill on estradiol 0.06% and progesterone 200 mg. Rx sent.

## 2018-03-05 NOTE — Telephone Encounter (Signed)
[facility] pharmacist called back stating wrong Rx had been sent to pharmacy. Patient has been taking estradiol 0.75-1.25mg  (0.06%) I sent this Rx in to pharmacy.

## 2018-04-18 ENCOUNTER — Encounter: Payer: BLUE CROSS/BLUE SHIELD | Admitting: Gynecology

## 2018-05-15 ENCOUNTER — Ambulatory Visit (INDEPENDENT_AMBULATORY_CARE_PROVIDER_SITE_OTHER): Payer: BLUE CROSS/BLUE SHIELD | Admitting: Gynecology

## 2018-05-15 ENCOUNTER — Encounter: Payer: Self-pay | Admitting: Gynecology

## 2018-05-15 VITALS — BP 136/84 | Ht 64.5 in | Wt 167.0 lb

## 2018-05-15 DIAGNOSIS — N950 Postmenopausal bleeding: Secondary | ICD-10-CM | POA: Diagnosis not present

## 2018-05-15 DIAGNOSIS — N952 Postmenopausal atrophic vaginitis: Secondary | ICD-10-CM | POA: Diagnosis not present

## 2018-05-15 DIAGNOSIS — Z01419 Encounter for gynecological examination (general) (routine) without abnormal findings: Secondary | ICD-10-CM

## 2018-05-15 DIAGNOSIS — Z79890 Hormone replacement therapy (postmenopausal): Secondary | ICD-10-CM | POA: Diagnosis not present

## 2018-05-15 MED ORDER — PROGESTERONE MICRONIZED 100 MG PO CAPS: 100.0000 mg | ORAL_CAPSULE | Freq: Every day | ORAL | 4 refills | Status: DC

## 2018-05-15 MED ORDER — LEVOTHYROXINE SODIUM 75 MCG PO TABS: 75.0000 ug | ORAL_TABLET | Freq: Every day | ORAL | 2 refills | Status: AC

## 2018-05-15 MED ORDER — ESTRADIOL 0.75 MG/1.25 GM (0.06%) TD GEL: TRANSDERMAL | 2 refills | Status: DC

## 2018-05-15 NOTE — Progress Notes (Signed)
    Kristie Hansen 1955/12/12 [id number]        62 y.o.  G2P2 for annual gynecologic exam.  Currently on HRT to include Estrogel and Prometrium 200 mg first 12 days of each month.  She has had some spotting recently but notes that she does not take her progesterone consistently.  Past medical history,surgical history, problem list, medications, allergies, family history and social history were all reviewed and documented as reviewed in the EPIC chart.  ROS:  Performed with pertinent positives and negatives included in the history, assessment and plan.   Additional significant findings : None   Exam: Kristie Hansen assistant Vitals:   05/15/18 1413  BP: 136/84  Weight: 167 lb (75.8 kg)  Height: 5' 4.5" (1.638 m)   Body mass index is 28.22 kg/m.  General appearance:  Normal affect, orientation and appearance. Skin: Grossly normal HEENT: Without gross lesions.  No cervical or supraclavicular adenopathy. Thyroid normal.  Lungs:  Clear without wheezing, rales or rhonchi Cardiac: RR, without RMG Abdominal:  Soft, nontender, without masses, guarding, rebound, organomegaly or hernia Breasts:  Examined lying and sitting without masses, retractions, discharge or axillary adenopathy. Pelvic:  Ext, BUS, Vagina: Normal with atrophic changes  Cervix: Normal with atrophic changes  Uterus: Anteverted, normal size, shape and contour, midline and mobile nontender   Adnexa: Without masses or tenderness    Anus and perineum: Normal   Rectovaginal: Normal sphincter tone without palpated masses or tenderness.    Assessment/Plan:  62 y.o. G2P2 female for annual gynecologic exam.   1. Spotting on HRT.  Is using Estrogel and Prometrium 200 mg first 12 days of each month.  Had some spotting but notes that she has forgotten her progesterone at times.  Differential with bleeding was discussed to include hormonal irregularity, structural abnormality such as polyps, hyperplasia and uterine cancer.  Recommend we  proceed with baseline sonohysterogram for endometrial assessment and rule out significant pathology and she agrees to schedule.  I discussed HRT in detail to include various studies such as WHI.  Risks to include thrombosis such as stroke heart attack DVT and breast cancer issues reviewed.  Benefits as far as symptom relief and possible cardiovascular and bone health when started early also discussed.  She did run out of her Estrogel for a week or so and had significant hot flushes and sweats.  At this point she would like to continue.  Various regimens discussed to include oral transdermal and transvaginal.  She is comfortable with the Estrogel but does not like intermittent progesterone as she has issues remembering.  Recommend switching to Prometrium 100 mg nightly.  Annual prescription written.  Patient will go ahead and start this and see how she does.  She will follow-up for her sonohysterogram. 2. Mammography due now and I reminded her to schedule this.  Breast exam normal today. 3. DEXA 2017 normal.  Recommend repeat at 5-year interval. 4. Pap smear 2018.  No Pap smear done today.  No history of significant abnormal Pap smears.  Plan repeat Pap smear at 3-year interval per current screening guidelines. 5. Colonoscopy 5 years ago.  Repeat at their recommended interval. 6. Health maintenance.  No routine lab work done as patient's having this done at her primary physician's office.  I did refill her thyroid to get her through till her January appointment with her primary physician.  Follow-up for the sonohysterogram as scheduled.   Anastasio Auerbach MD, 3:02 PM 05/15/2018

## 2018-05-15 NOTE — Patient Instructions (Signed)
Follow-up for the ultrasound as scheduled. 

## 2018-05-25 ENCOUNTER — Other Ambulatory Visit: Payer: Self-pay | Admitting: Gynecology

## 2018-06-04 ENCOUNTER — Other Ambulatory Visit: Payer: Self-pay | Admitting: Gynecology

## 2018-06-04 DIAGNOSIS — N950 Postmenopausal bleeding: Secondary | ICD-10-CM

## 2018-06-13 ENCOUNTER — Ambulatory Visit: Payer: BLUE CROSS/BLUE SHIELD

## 2018-06-13 ENCOUNTER — Ambulatory Visit (INDEPENDENT_AMBULATORY_CARE_PROVIDER_SITE_OTHER): Payer: BLUE CROSS/BLUE SHIELD | Admitting: Gynecology

## 2018-06-13 ENCOUNTER — Encounter: Payer: Self-pay | Admitting: Gynecology

## 2018-06-13 VITALS — BP 124/80

## 2018-06-13 DIAGNOSIS — N950 Postmenopausal bleeding: Secondary | ICD-10-CM | POA: Diagnosis not present

## 2018-06-13 DIAGNOSIS — Z79890 Hormone replacement therapy (postmenopausal): Secondary | ICD-10-CM

## 2018-06-13 NOTE — Patient Instructions (Signed)
Office will call you with biopsy results 

## 2018-06-13 NOTE — Progress Notes (Signed)
    Kristie Hansen 31-Oct-1955 [id number]        62 y.o.  G2P2 presents for sonohysterogram.  History of Estrogel daily and Prometrium 200 mg first 12 days of each month.  Had spotting on and off.  Most recently switched to Estrogel daily with Prometrium 100 mg nightly.  Past medical history,surgical history, problem list, medications, allergies, family history and social history were all reviewed and documented in the EPIC chart.  Directed ROS with pertinent positives and negatives documented in the history of present illness/assessment and plan.  Exam: Pam [place] assistant Vitals:   06/13/18 1549  BP: 124/80   General appearance:  Normal Diminished soft nontender without masses guarding rebound Pelvic external BUS vagina and normal with atrophic changes.  Cervix normal with atrophic changes.  Uterus normal size midline mobile nontender.  Adnexa without masses or tenderness.  Ultrasound transvaginal and transabdominal shows uterus normal size and echotexture.  Endometrial echo 10.5 mm.  Right and left ovaries normal.  Cul-de-sac negative.  Sonohysterogram performed, sterile technique, easy catheter introduction, good distention with no abnormality seen.  Endometrial biopsy taken.  Patient tolerated well.  Assessment/Plan:  62 y.o. G2P2 with spotting on HRT.  Recently switched regimen to a continuous progesterone.  Ultrasound today shows generous endometrial echo but no abnormalities on sonohysterogram.  We discussed not unusual to see a thicker endometrium in patients on estrogen.  Does not necessarily reflect pathology.  The patient will follow-up for the biopsy results.  If negative then plan expectant management with monitoring for bleeding on the new regiment.  If otherwise then will triaged based upon results.    Anastasio Auerbach MD, 4:09 PM 06/13/2018

## 2018-06-15 ENCOUNTER — Encounter: Payer: Self-pay | Admitting: Gynecology

## 2018-06-15 ENCOUNTER — Encounter: Payer: BLUE CROSS/BLUE SHIELD | Admitting: Gynecology

## 2018-09-07 ENCOUNTER — Other Ambulatory Visit: Payer: Self-pay | Admitting: Gynecology

## 2018-09-19 ENCOUNTER — Other Ambulatory Visit: Payer: Self-pay | Admitting: Gynecology

## 2018-09-20 ENCOUNTER — Other Ambulatory Visit: Payer: Self-pay | Admitting: Gynecology

## 2018-09-20 NOTE — Telephone Encounter (Signed)
Received request for thyroid medication.  Was not aware that we were prescribing this for her.  We have not done any blood work in over 2 years.  I thought that the other doctor was doing her blood work and would be prescribing her thyroid.

## 2019-04-02 ENCOUNTER — Other Ambulatory Visit: Payer: Self-pay | Admitting: Gynecology

## 2019-04-23 ENCOUNTER — Encounter: Payer: Self-pay | Admitting: Gynecology

## 2019-04-30 ENCOUNTER — Other Ambulatory Visit: Payer: Self-pay | Admitting: Gynecology

## 2019-05-17 ENCOUNTER — Other Ambulatory Visit: Payer: Self-pay

## 2019-05-20 ENCOUNTER — Other Ambulatory Visit: Payer: Self-pay

## 2019-05-20 ENCOUNTER — Ambulatory Visit: Payer: BC Managed Care – PPO | Admitting: Gynecology

## 2019-05-20 ENCOUNTER — Encounter: Payer: Self-pay | Admitting: Gynecology

## 2019-05-20 VITALS — BP 134/82 | Ht 65.0 in | Wt 165.0 lb

## 2019-05-20 DIAGNOSIS — N952 Postmenopausal atrophic vaginitis: Secondary | ICD-10-CM | POA: Diagnosis not present

## 2019-05-20 DIAGNOSIS — Z01419 Encounter for gynecological examination (general) (routine) without abnormal findings: Secondary | ICD-10-CM

## 2019-05-20 DIAGNOSIS — Z79890 Hormone replacement therapy (postmenopausal): Secondary | ICD-10-CM

## 2019-05-20 MED ORDER — ESTROGEL 0.75 MG/1.25 GM (0.06%) TD GEL: TRANSDERMAL | 6 refills | Status: DC

## 2019-05-20 MED ORDER — PROGESTERONE MICRONIZED 100 MG PO CAPS: 100.0000 mg | ORAL_CAPSULE | Freq: Every day | ORAL | 4 refills | Status: DC

## 2019-05-20 NOTE — Patient Instructions (Signed)
Follow-up in 1 year for annual exam, sooner as needed. 

## 2019-05-20 NOTE — Progress Notes (Signed)
    Kristie Hansen Nov 02, 1955 [id number]        63 y.o.  G2P2 for annual gynecologic exam.  Without gynecologic complaints.  Continues on HRT.  Past medical history,surgical history, problem list, medications, allergies, family history and social history were all reviewed and documented as reviewed in the EPIC chart.  ROS:  Performed with pertinent positives and negatives included in the history, assessment and plan.   Additional significant findings : None   Exam: Caryn Bee assistant Vitals:   05/20/19 1516  BP: 134/82  Weight: 165 lb (74.8 kg)  Height: 5\' 5"  (1.651 m)   Body mass index is 27.46 kg/m.  General appearance:  Normal affect, orientation and appearance. Skin: Grossly normal HEENT: Without gross lesions.  No cervical or supraclavicular adenopathy. Thyroid normal.  Lungs:  Clear without wheezing, rales or rhonchi Cardiac: RR, without RMG Abdominal:  Soft, nontender, without masses, guarding, rebound, organomegaly or hernia Breasts:  Examined lying and sitting without masses, retractions, discharge or axillary adenopathy. Pelvic:  Ext, BUS, Vagina: With atrophic changes  Cervix: With atrophic changes  Uterus: Anteverted, normal size, shape and contour, midline and mobile nontender   Adnexa: Without masses or tenderness    Anus and perineum: Normal   Rectovaginal: Normal sphincter tone without palpated masses or tenderness.    Assessment/Plan:  62 y.o. G2P2 female for annual gynecologic exam.   1. Postmenopausal/HRT.  Continues on 1 pump of Estrogel and Prometrium 100 mg nightly.  No bleeding.  Was evaluated last year with sonohysterogram and endometrial biopsy both of which were negative.  Has done no bleeding since.  Would like to continue.  We again discussed risks versus benefits to include risks of thrombosis such as stroke heart attack DVT in the breast cancer issue.  Refill both x1 year. 2. DEXA 2017 normal.  Plan repeat DEXA at 5-year interval. 3.  Mammography overdue and I reminded her to schedule this and she agrees to call and schedule.  Breast exam normal today. 4. Pap smear 2018.  Pap smear done today.  No history of abnormal Pap smears previously. 5. Colonoscopy 2014.  Repeat at their recommended interval. 6. Health maintenance.  No routine lab work done as this is done elsewhere.  Follow-up in 1 year, sooner as needed.   Anastasio Auerbach MD, 3:49 PM 05/20/2019

## 2019-05-21 LAB — PAP IG W/ RFLX HPV ASCU

## 2019-07-24 ENCOUNTER — Ambulatory Visit: Payer: BC Managed Care – PPO | Attending: Internal Medicine

## 2019-07-24 ENCOUNTER — Other Ambulatory Visit: Payer: Self-pay

## 2019-07-24 DIAGNOSIS — Z20822 Contact with and (suspected) exposure to covid-19: Secondary | ICD-10-CM

## 2019-07-26 LAB — NOVEL CORONAVIRUS, NAA: SARS-CoV-2, NAA: NOT DETECTED

## 2020-05-21 ENCOUNTER — Telehealth: Payer: Self-pay | Admitting: *Deleted

## 2020-05-21 MED ORDER — ESTROGEL 0.75 MG/1.25 GM (0.06%) TD GEL: TRANSDERMAL | 1 refills | Status: DC

## 2020-05-21 NOTE — Telephone Encounter (Signed)
Patient has annual exam scheduled on 06/24/20 needs refill on estrogel 0.75 mg gel. Rx sent.

## 2020-06-11 ENCOUNTER — Other Ambulatory Visit: Payer: Self-pay | Admitting: *Deleted

## 2020-06-11 MED ORDER — PROGESTERONE MICRONIZED 100 MG PO CAPS: 100.0000 mg | ORAL_CAPSULE | Freq: Every day | ORAL | 0 refills | Status: DC

## 2020-06-11 NOTE — Telephone Encounter (Signed)
ANNUAL EXAM SCHEDULED ON 06/24/20

## 2020-06-24 ENCOUNTER — Encounter: Payer: BC Managed Care – PPO | Admitting: Nurse Practitioner

## 2020-06-29 ENCOUNTER — Encounter: Payer: Self-pay | Admitting: Nurse Practitioner

## 2020-06-29 ENCOUNTER — Other Ambulatory Visit: Payer: Self-pay

## 2020-06-29 ENCOUNTER — Ambulatory Visit (INDEPENDENT_AMBULATORY_CARE_PROVIDER_SITE_OTHER): Payer: BC Managed Care – PPO | Admitting: Nurse Practitioner

## 2020-06-29 VITALS — BP 144/90 | Ht 64.0 in | Wt 165.0 lb

## 2020-06-29 DIAGNOSIS — Z79890 Hormone replacement therapy (postmenopausal): Secondary | ICD-10-CM

## 2020-06-29 DIAGNOSIS — Z01419 Encounter for gynecological examination (general) (routine) without abnormal findings: Secondary | ICD-10-CM | POA: Diagnosis not present

## 2020-06-29 NOTE — Patient Instructions (Signed)

## 2020-06-29 NOTE — Progress Notes (Signed)
   Kristie Hansen 10/03/1955 [id number]   History:  64 y.o. G2P2 presents for annual exam. Postmenopausal - on HRT, no bleeding. 2019 PMB with negative SHGM and biopsy. Normal pap and mammogram history.   Gynecologic History No LMP recorded. Patient is postmenopausal.   Last Pap: 05/20/2019. Results were: normal Last mammogram: 03/2017. Results were: normal Last colonoscopy: 2014. Results were: normal Last Dexa: 2017. Results were: normal  Past medical history, past surgical history, family history and social history were all reviewed and documented in the EPIC chart.  ROS:  A ROS was performed and pertinent positives and negatives are included.  Exam:  Vitals:   06/29/20 0940  BP: (!) 144/90  Weight: 165 lb (74.8 kg)  Height: 5\' 4"  (1.626 m)   Body mass index is 28.32 kg/m.  General appearance:  Normal Thyroid:  Symmetrical, normal in size, without palpable masses or nodularity. Respiratory  Auscultation:  Clear without wheezing or rhonchi Cardiovascular  Auscultation:  Regular rate, without rubs, murmurs or gallops  Edema/varicosities:  Not grossly evident Abdominal  Soft,nontender, without masses, guarding or rebound.  Liver/spleen:  No organomegaly noted  Hernia:  None appreciated  Skin  Inspection:  Grossly normal   Breasts: Examined lying and sitting.   Right: Without masses, retractions, discharge or axillary adenopathy.   Left: Without masses, retractions, discharge or axillary adenopathy. Gentitourinary   Inguinal/mons:  Normal without inguinal adenopathy  External genitalia:  Normal  BUS/Urethra/Skene's glands:  Normal  Vagina:  Normal  Cervix:  Normal  Uterus:  Normal in size, shape and contour.  Midline and mobile  Adnexa/parametria:     Rt: Without masses or tenderness.   Lt: Without masses or tenderness.  Anus and perineum: Normal  Digital rectal exam: Normal sphincter tone without palpated masses or tenderness  Assessment/Plan:  64 y.o. G2P2 for  annual exam.   Well female exam with routine gynecological exam - Education provided on SBEs, importance of preventative screenings, current guidelines, high calcium diet, regular exercise, and multivitamin daily. Labs with PCP.   Postmenopausal HRT (hormone replacement therapy) -using Estrogel every other day and Prometrium daily.  We discussed the risk versus benefit with recommendations to try to wean.  She will apply half dose of estradiol every other day for 2 to 4 weeks while continuing the Prometrium daily.  If tolerating she will stop together.  Screening for cervical cancer -no history of significantly abnormal Pap smears.  Will repeat at 5-year interval per guidelines.  Screening for breast cancer -normal mammogram history.  Overdue for screening mammogram and she plans to schedule this soon.  Discussed current guidelines and importance of preventative screenings.  Normal breast exam today.  Screening for colon cancer -normal colonoscopy in 2014.  She will repeat at [facility] recommended interval.  Screening for osteoporosis -normal bone density in 2017.  Will repeat at age 55. One of her older sisters does have osteoporosis.  Follow-up in 1 year for annual.     Tamela Gammon [facility], 10:15 AM 06/29/2020

## 2020-07-30 ENCOUNTER — Inpatient Hospital Stay (HOSPITAL_COMMUNITY): Admission: RE | Admit: 2020-07-30 | Payer: BC Managed Care – PPO | Source: Ambulatory Visit

## 2020-07-30 ENCOUNTER — Other Ambulatory Visit (HOSPITAL_COMMUNITY): Payer: Self-pay | Admitting: Nurse Practitioner

## 2020-07-30 ENCOUNTER — Telehealth: Payer: Self-pay | Admitting: *Deleted

## 2020-07-30 DIAGNOSIS — Z1231 Encounter for screening mammogram for malignant neoplasm of breast: Secondary | ICD-10-CM

## 2020-07-30 DIAGNOSIS — E2839 Other primary ovarian failure: Secondary | ICD-10-CM

## 2020-07-30 DIAGNOSIS — Z780 Asymptomatic menopausal state: Secondary | ICD-10-CM

## 2020-07-30 NOTE — Telephone Encounter (Signed)
Patient informed, aware to call and schedule.

## 2020-07-30 NOTE — Telephone Encounter (Signed)
Yes that's fine to order DEXA. Thank you!

## 2020-07-30 NOTE — Telephone Encounter (Signed)
Patient called requesting dexa order placed at [facility], last dexa in 2017, per note on 06/29/20 you recommended repeat dexa at 67? Patient would like to have dexa sooner due to sisters having osteoporosis. Okay to order?

## 2020-08-05 ENCOUNTER — Encounter (HOSPITAL_COMMUNITY): Payer: BC Managed Care – PPO

## 2020-08-05 ENCOUNTER — Other Ambulatory Visit (HOSPITAL_COMMUNITY): Payer: BC Managed Care – PPO

## 2020-08-17 ENCOUNTER — Other Ambulatory Visit (HOSPITAL_COMMUNITY): Payer: BC Managed Care – PPO

## 2020-08-17 ENCOUNTER — Encounter (HOSPITAL_COMMUNITY): Payer: BC Managed Care – PPO

## 2020-08-20 ENCOUNTER — Ambulatory Visit (HOSPITAL_COMMUNITY)
Admission: RE | Admit: 2020-08-20 | Discharge: 2020-08-20 | Disposition: A | Discharge status: Home / Self Care | Payer: BC Managed Care – PPO | Source: Ambulatory Visit | Attending: Nurse Practitioner | Admitting: Nurse Practitioner

## 2020-08-20 ENCOUNTER — Other Ambulatory Visit: Payer: Self-pay

## 2020-08-20 DIAGNOSIS — Z1231 Encounter for screening mammogram for malignant neoplasm of breast: Secondary | ICD-10-CM | POA: Insufficient documentation

## 2020-08-20 DIAGNOSIS — E2839 Other primary ovarian failure: Secondary | ICD-10-CM | POA: Insufficient documentation

## 2020-08-20 DIAGNOSIS — Z780 Asymptomatic menopausal state: Secondary | ICD-10-CM | POA: Insufficient documentation

## 2020-09-03 ENCOUNTER — Other Ambulatory Visit: Payer: Self-pay | Admitting: Nurse Practitioner

## 2020-09-03 NOTE — Telephone Encounter (Signed)
Call to patient. Patient states she is no longer taking progesterone and does not need a refill. RN advised would update pharmacy. Patient agreeable.

## 2020-09-08 ENCOUNTER — Other Ambulatory Visit: Payer: Self-pay | Admitting: Nurse Practitioner

## 2020-09-09 ENCOUNTER — Telehealth: Payer: Self-pay

## 2020-09-09 NOTE — Telephone Encounter (Signed)
I called patient because her pharmacy sent refill request for Progesterone caps for the 2nd time in 6 days.  Patient said she has successfully weaned of the Rx's and does not need these Rx's any longer.  Pharmacy notified.

## 2020-09-18 ENCOUNTER — Other Ambulatory Visit: Payer: Self-pay | Admitting: Internal Medicine

## 2020-09-18 DIAGNOSIS — R1011 Right upper quadrant pain: Secondary | ICD-10-CM

## 2020-10-01 ENCOUNTER — Ambulatory Visit
Admission: RE | Admit: 2020-10-01 | Discharge: 2020-10-01 | Disposition: A | Discharge status: Home / Self Care | Payer: BC Managed Care – PPO | Source: Ambulatory Visit | Attending: Internal Medicine | Admitting: Internal Medicine

## 2020-10-01 DIAGNOSIS — R1011 Right upper quadrant pain: Secondary | ICD-10-CM

## 2020-12-18 ENCOUNTER — Other Ambulatory Visit: Payer: Self-pay

## 2020-12-18 ENCOUNTER — Ambulatory Visit: Payer: BC Managed Care – PPO | Admitting: Nurse Practitioner

## 2020-12-18 ENCOUNTER — Encounter: Payer: Self-pay | Admitting: Nurse Practitioner

## 2020-12-18 VITALS — BP 136/90 | HR 69

## 2020-12-18 DIAGNOSIS — N898 Other specified noninflammatory disorders of vagina: Secondary | ICD-10-CM

## 2020-12-18 DIAGNOSIS — N362 Urethral caruncle: Secondary | ICD-10-CM | POA: Diagnosis not present

## 2020-12-18 LAB — URINALYSIS, COMPLETE
Bilirubin Urine: NEGATIVE
Glucose, UA: NEGATIVE
Hyaline Cast: NONE SEEN /LPF
Ketones, ur: NEGATIVE
Nitrite: NEGATIVE
Protein, ur: NEGATIVE
Specific Gravity, Urine: 1.015 (ref 1.001–1.035)
pH: 6.5 (ref 5.0–8.0)

## 2020-12-18 LAB — WET PREP FOR TRICH, YEAST, CLUE

## 2020-12-18 MED ORDER — ESTRADIOL 0.1 MG/GM VA CREA: TOPICAL_CREAM | VAGINAL | 1 refills | Status: DC

## 2020-12-18 NOTE — Patient Instructions (Addendum)
Urethral caruncle You may use estrogen applied directly to the urethra, initially use every day for about 1 week, then twice weekly. You can also use the applicator and use 1/2 gram to 1 gram twice weekly in the vagina for vaginal dryness or burning if needed  Genitourinary Syndrome of Menopause (GSM)  Genitourinary Syndrome of menopause is a term that describes the spectrum of changes caused by the lack of estrogen in menopause. Common Signs and Symptoms Include: Vaginal dryness, irritation/burning/itching. Abnormal discharge, vaginal pressure, pain with sex, decreased sexual arousal/desire, difficulty achieving orgasm, pain with urination, urgency, incontinence of urine, recurrent bladder infections, urethral prolapse and others. Diagnosis can usually be made with history and pelvic exam. Other testing may be considered to rule out other potential abnormalities. Symptoms can be progressive and chronic. The goal of treatment is symptom relief.  There are several different approaches to improving symptoms:  Vaginal Moisturizers and lubricants: Glycerin Moisturizer (Replens), Silicone based products (Uberlube), water-based products (Restore Moisturizing Gel by [employer]), Hyaluronic Acid vaginal products (such as HYALO GYN), and natural oils such as Olive Oil, Almond Oil and Coconut Oil (Since coconut oil comes in solid preparations, you can form them into suppositories and insert into the vagina as needed). These products are Over-the-Counter (OTC) at [employer] and retail stores and DO NOT contain hormones.  How to use vaginal and vulvar moisturizers . Many vaginal moisturizers come with an applicator. You will need fill the applicator with the moisturizer and then insert it carefully into your vagina. You can put lubricant on the tip of the applicator to make it easier to insert into your vagina. . You can also use vaginal moisturizers on your vulvar tissues, including your inner and outer  labia (the folds of skin around your vagina). To put these moisturizers on your vulva, put a small amount (pea or grape size) of moisturizer on your finger. Then, massage the moisturizer into your vaginal opening and onto your labia. . If you recently finished cancer treatment, or are going through sudden menopause, you may need to use the moisturizers 3 to 5 times a week to relieve your symptoms. . Vaginal and vulvar moisturizers should be used before you go to bed, so the product can be fully absorbed.  Lifestyle modifications: smoking cessation, pelvic floor physical therapy, Kegel's exercises, vaginal dilators  Non-hormonal therapies: Lidocaine (topical anesthetic) to decrease sensitivity, Oral Ospemifeme (requires prescription), Laser therapy (Pros and Cons should be discussed with provider)  Hormones Therapies: For moderate to severe GSM, vaginal estrogen is considered the most effective treatment. It can be used in combination with vaginal moisturizers and lubricants. Estrogen is delivered in small doses in the vagina with various preparations available including creams, rings, and tablets. Estrogen therapy may be contraindicated with certain hormone sensitive cancers. Vaginal DHEA and Testosterone have shown effectiveness in some cases to help with relieving vaginal atrophy and have shown mixed results with libido. Risks and benefits should be discussed prior to starting therapy.

## 2020-12-18 NOTE — Progress Notes (Signed)
GYNECOLOGY  VISIT  CC:   Reports a weird feeling in vagina  HPI: 66 y.o. G2P2 Married White or Caucasian female here for evaluation of vaginal vs urinary tract infection Feeling like she is urinating more frequently Feeling more "wet" in vaginal area and on underwear Denies pain, no burning, no irritation No concern of STD  Period Cycle (Days):  (Postmenopausal)  GYNECOLOGIC HISTORY: No LMP recorded. Patient is postmenopausal. Menopausal hormone therapy: none  Patient Active Problem List   Diagnosis Date Noted  . 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 Constipation 03/07/2016  . Other specified hypothyroidism 06/24/2015  . Endometrial polyp 05/29/2014  . History of cervical dysplasia 01/02/2014  . Postmenopausal HRT (hormone replacement therapy) 12/10/2012  . Menopause 12/10/2012    Past Medical History:  Diagnosis Date  . ADD (attention deficit disorder)   . 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 Depression   . Hormone disorder     Past Surgical History:  Procedure Laterality Date  . FINGER SURGERY    . TUMOR REMOVAL     "FATTY TUMOR" BEFORE 15 YRS OF AGE    MEDS:   Current Outpatient Medications on File Prior to Visit  Medication Sig Dispense Refill  . amphetamine-dextroamphetamine (ADDERALL) 10 MG tablet Take 10 mg by mouth daily.    . hydrocortisone (ANUSOL-HC) 2.5 % rectal cream Place 1 application rectally 2 (two) times daily. 30 g 3  . lamoTRIgine (LAMICTAL) 150 MG tablet Take 150 mg by mouth daily.    Marland Kitchen levothyroxine (SYNTHROID, LEVOTHROID) 75 MCG tablet Take 1 tablet (75 mcg total) by mouth daily before breakfast. 30 tablet 2   No current facility-administered medications on file prior to visit.    ALLERGIES: Shellfish allergy, Codeine, and Other  Family History  Problem Relation Age of Onset  . Aneurysm Mother   . Hypertension Sister      Review of Systems  PHYSICAL EXAMINATION:    BP 136/90 (BP Location: Right Arm)   Pulse  69   SpO2 99%     General appearance: alert, cooperative, no acute distress  Pelvic: External genitalia:  no lesions              Urethra:  Prolapsing tissue c/w caruncle              Bartholins and Skenes: normal                 Vagina: normal appearing vagina, appropriate for age, atrophy noted              Cervix: no cervical motion tenderness and no lesions              Bimanual Exam:  Uterus:  normal size, contour, position, consistency, mobility, non-tender              Adnexa: non tender, no masses               Chaperone, Jill, RN, was present for exam.  Assessment: Urethral caruncle  Vaginal irritation - Plan: Urinalysis, Complete, WET PREP FOR TRICH, YEAST, CLUE, estradiol (ESTRACE) 0.1 MG/GM vaginal cream    Plan: Discussed vaginal estrogen use F/U for annual exam 06/2021

## 2020-12-18 NOTE — Progress Notes (Deleted)
GYNECOLOGY  VISIT  CC:   ***  HPI: 65 y.o. G2P2 Married White or Caucasian female here for ***.     GYNECOLOGIC HISTORY: No LMP recorded. Patient is postmenopausal. Contraception: *** Menopausal hormone therapy: ***  Patient Active Problem List   Diagnosis Date Noted  . 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 Constipation 03/07/2016  . Other specified hypothyroidism 06/24/2015  . Endometrial polyp 05/29/2014  . History of cervical dysplasia 01/02/2014  . Postmenopausal HRT (hormone replacement therapy) 12/10/2012  . Menopause 12/10/2012    Past Medical History:  Diagnosis Date  . ADD (attention deficit disorder)   . 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 Depression   . Hormone disorder     Past Surgical History:  Procedure Laterality Date  . FINGER SURGERY    . TUMOR REMOVAL     "FATTY TUMOR" BEFORE 15 YRS OF AGE    MEDS:   Current Outpatient Medications on File Prior to Visit  Medication Sig Dispense Refill  . amphetamine-dextroamphetamine (ADDERALL) 10 MG tablet Take 10 mg by mouth daily.    . Estradiol (ESTROGEL) 0.75 MG/1.25 GM (0.06%) topical gel Apply as directed 50 g 1  . hydrocortisone (ANUSOL-HC) 2.5 % rectal cream Place 1 application rectally 2 (two) times daily. 30 g 3  . lamoTRIgine (LAMICTAL) 150 MG tablet Take 150 mg by mouth daily.    Marland Kitchen levothyroxine (SYNTHROID, LEVOTHROID) 75 MCG tablet Take 1 tablet (75 mcg total) by mouth daily before breakfast. 30 tablet 2  . progesterone (PROMETRIUM) 100 MG capsule Take 1 capsule (100 mg total) by mouth daily. 90 capsule 0   No current facility-administered medications on file prior to visit.    ALLERGIES: Shellfish allergy, Codeine, and Other  Family History  Problem Relation Age of Onset  . Aneurysm Mother   . Hypertension Sister      Review of Systems  PHYSICAL EXAMINATION:    There were no vitals taken for this visit.    General appearance: alert, cooperative, no acute  distress  CV:  {Exam; heart brief:31539} Lungs:  {pe lungs ob:314451::"clear to auscultation, no wheezes, rales or rhonchi, symmetric air entry"} Breasts: {Exam; breast:13139::"normal appearance, no masses or tenderness"} Abdomen: soft, non-tender; no masses,  no organomegaly Lymph:  no inguinal LAD noted  Pelvic: External genitalia:  no lesions              Urethra:  normal appearing urethra with no masses, tenderness or lesions              Bartholins and Skenes: normal                 Vagina: normal appearing vagina               Cervix: {CHL AMB PHY EX CERVIX NORM [phone]::"no lesions"}              Bimanual Exam:  Uterus:  {CHL AMB PHY EX UTERUS NORM DEFAULT:[phone]::"normal size, contour, position, consistency, mobility, non-tender"}              Adnexa: {CHL AMB PHY EX ADNEXA NO MASS DEFAULT:[phone]::"no mass, fullness, tenderness"}               Chaperone, ***, CMA, was present for exam.  Assessment: ***  Plan: ***   {NUMBERS; -10-45 JOINT ROM:10287} minutes of total time was spent for this patient encounter, including preparation, face-to-face counseling with the patient and coordination of care, and documentation of the  encounter.

## 2021-08-20 ENCOUNTER — Other Ambulatory Visit: Payer: Self-pay | Admitting: Obstetrics and Gynecology

## 2021-09-22 ENCOUNTER — Telehealth: Payer: Self-pay | Admitting: *Deleted

## 2021-09-22 NOTE — Telephone Encounter (Signed)
Left message for patient to call, we received a fax from her PCP Dr.Holwerda with labs orders for: CBC,CMP,TSH,FREE T4,LIPID PANEL with dx code: E03.9/Z00.00 ?
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
## 2021-10-11 ENCOUNTER — Ambulatory Visit: Payer: BC Managed Care – PPO | Admitting: Nurse Practitioner

## 2021-10-11 DIAGNOSIS — Z0289 Encounter for other administrative examinations: Secondary | ICD-10-CM

## 2021-10-11 NOTE — Progress Notes (Deleted)
? ?  Kristie Hansen Feb 27, 1956 [id number] ? ? ?History:  66 y.o. G2P2 presents for annual exam. Postmenopausal - stopped HRT this past year but using vaginal estrogen for dryness. 2019 PMB with negative SHGM and biopsy. Normal pap and mammogram history. Hypothyroidism, depression managed by PCP. History of melanoma.  ? ?Gynecologic History ?No LMP recorded. Patient is postmenopausal. ?  ?Contraception/Family planning: post menopausal status ?Sexually active: Yes ? ?Health maintenance ?Last Pap: 05/20/2019. Results were: Normal ?Last mammogram: 08/20/2020. Results were: Normal ?Last colonoscopy: 2014. Results were: normal ?Last Dexa: 08/20/2020. Results were: Normal ? ?Past medical history, past surgical history, family history and social history were all reviewed and documented in the EPIC chart. ? ?ROS:  A ROS was performed and pertinent positives and negatives are included. ? ?Exam: ? ?There were no vitals filed for this visit. ? ?There is no height or weight on file to calculate BMI. ? ?General appearance:  Normal ?Thyroid:  Symmetrical, normal in size, without palpable masses or nodularity. ?Respiratory ? Auscultation:  Clear without wheezing or rhonchi ?Cardiovascular ? Auscultation:  Regular rate, without rubs, murmurs or gallops ? Edema/varicosities:  Not grossly evident ?Abdominal ? Soft,nontender, without masses, guarding or rebound. ? Liver/spleen:  No organomegaly noted ? Hernia:  None appreciated ? Skin ? Inspection:  Grossly normal ?  ?Breasts: Examined lying and sitting.  ? Right: Without masses, retractions, discharge or axillary adenopathy. ? ? Left: Without masses, retractions, discharge or axillary adenopathy. ?Genitourinary  ? Inguinal/mons:  Normal without inguinal adenopathy ? External genitalia:  Normal appearing vulva with no masses, tenderness, or lesions ? BUS/Urethra/Skene's glands:  Normal ? Vagina:  Normal appearing with normal color and discharge, no lesions ? Cervix:  Normal appearing without  discharge or lesions ? Uterus:  Normal in size, shape and contour.  Midline and mobile, nontender ? Adnexa/parametria:   ?  Rt: Normal in size, without masses or tenderness. ?  Lt: Normal in size, without masses or tenderness. ? Anus and perineum: Normal ? Digital rectal exam: Normal sphincter tone without palpated masses or tenderness ? ?Patient informed chaperone available to be present for breast and pelvic exam. Patient has requested no chaperone to be present. Patient has been advised what will be completed during breast and pelvic exam.  ? ?Assessment/Plan:  66 y.o. G2P2 for annual exam.  ? ?Well female exam with routine gynecological exam - Education provided on SBEs, importance of preventative screenings, current guidelines, high calcium diet, regular exercise, and multivitamin daily. Labs with PCP.  ? ?Screening for cervical cancer - Normal pap history.  Pap due today.  ? ?Screening for breast cancer - Normal mammogram history.  Overdue for screening mammogram and she plans to schedule this soon.  Discussed current guidelines and importance of preventative screenings.  Normal breast exam today. ? ?Screening for colon cancer - Normal colonoscopy in 2014.  She will repeat at [facility] recommended interval. ? ?Screening for osteoporosis - Normal bone density 07/2020. Will repeat at 5-year interval per recommendations. Sister with osteoporosis.  ? ?Follow-up in 1 year for annual. ? ? ? ? ?Tamela Gammon [facility], 7:42 AM 10/11/2021 ? ?

## 2021-12-15 ENCOUNTER — Other Ambulatory Visit (HOSPITAL_COMMUNITY)
Admission: RE | Admit: 2021-12-15 | Discharge: 2021-12-15 | Disposition: A | Discharge status: Home / Self Care | Payer: BC Managed Care – PPO | Source: Ambulatory Visit | Attending: Nurse Practitioner | Admitting: Nurse Practitioner

## 2021-12-15 ENCOUNTER — Encounter: Payer: Self-pay | Admitting: Nurse Practitioner

## 2021-12-15 ENCOUNTER — Ambulatory Visit (INDEPENDENT_AMBULATORY_CARE_PROVIDER_SITE_OTHER): Payer: BC Managed Care – PPO | Admitting: Nurse Practitioner

## 2021-12-15 VITALS — BP 128/82 | Ht 64.0 in | Wt 161.0 lb

## 2021-12-15 DIAGNOSIS — Z01419 Encounter for gynecological examination (general) (routine) without abnormal findings: Secondary | ICD-10-CM | POA: Insufficient documentation

## 2021-12-15 DIAGNOSIS — N951 Menopausal and female climacteric states: Secondary | ICD-10-CM | POA: Diagnosis not present

## 2021-12-15 MED ORDER — ESTRADIOL 0.1 MG/GM VA CREA: TOPICAL_CREAM | VAGINAL | 2 refills | Status: DC

## 2021-12-15 NOTE — Progress Notes (Signed)
   Kristie Hansen 1955/08/18 [id number]   History:  66 y.o. G2P2 presents for annual exam. Postmenopausal - no HRT, no bleeding. Using vaginal estrogen but not consistently for dryness and painful intercourse. Normal pap and mammogram history. Hypothyroidism managed by PCP.   Gynecologic History No LMP recorded. Patient is postmenopausal.   Sexually active: Yes  Health maintenance Last Pap: 05/20/2019. Results were: Normal, 3-year repeat Last mammogram: 08/20/2020. Results were: Normal Last colonoscopy: 2022. Results were: Normal Last Dexa: 08/20/2020. Results were: Normal  Past medical history, past surgical history, family history and social history were all reviewed and documented in the EPIC chart. Married. Daughter Haze Boyden. 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   ROS:  A ROS was performed and pertinent positives and negatives are included.  Exam:  Vitals:   12/15/21 1511  BP: 128/82  Weight: 161 lb (73 kg)  Height: '5\' 4"'$  (1.626 m)    Body mass index is 27.64 kg/m.  General appearance:  Normal Thyroid:  Symmetrical, normal in size, without palpable masses or nodularity. Respiratory  Auscultation:  Clear without wheezing or rhonchi Cardiovascular  Auscultation:  Regular rate, without rubs, murmurs or gallops  Edema/varicosities:  Not grossly evident Abdominal  Soft,nontender, without masses, guarding or rebound.  Liver/spleen:  No organomegaly noted  Hernia:  None appreciated  Skin  Inspection:  Grossly normal   Breasts: Examined lying and sitting.   Right: Without masses, retractions, discharge or axillary adenopathy.   Left: Without masses, retractions, discharge or axillary adenopathy. Genitourinary   Inguinal/mons:  Normal without inguinal adenopathy  External genitalia:  Normal appearing vulva with no masses, tenderness, or lesions  BUS/Urethra/Skene's glands:  Normal  Vagina:  Normal appearing with normal color and discharge, no lesions. Atrophic changes  Cervix:   Normal appearing without discharge or lesions  Uterus:  Normal in size, shape and contour.  Midline and mobile, nontender  Adnexa/parametria:     Rt: Normal in size, without masses or tenderness.   Lt: Normal in size, without masses or tenderness.  Anus and perineum: Normal  Digital rectal exam: Normal sphincter tone without palpated masses or tenderness  Patient informed chaperone available to be present for breast and pelvic exam. Patient has requested no chaperone to be present. Patient has been advised what will be completed during breast and pelvic exam.   Assessment/Plan:  66 y.o. G2P2 for annual exam.   Well female exam with routine gynecological exam - Plan: Cytology - PAP( [facility]). Education provided on SBEs, importance of preventative screenings, current guidelines, high calcium diet, regular exercise, and multivitamin daily. Labs with PCP.   Menopausal vaginal dryness - Plan: estradiol (ESTRACE) 0.1 MG/GM vaginal cream twice weekly. Recommend consistent use for best results.   Screening for cervical cancer - Normal pap history. Pap with HR HPV today. If normal, we discussed option to stop screening per guidelines and she is agreeable.   Screening for breast cancer - Normal mammogram history.  Overdue for screening mammogram and she plans to schedule this soon.  Discussed current guidelines and importance of preventative screenings.  Normal breast exam today.  Screening for colon cancer - Normal colonoscopy in 2022.  She will repeat at [facility] recommended interval.  Screening for osteoporosis - Normal bone density in 2022.  Follow-up in 1 year for annual.     Tamela Gammon [facility], 3:40 PM 12/15/2021

## 2021-12-17 LAB — CYTOLOGY - PAP
Comment: NEGATIVE
Diagnosis: NEGATIVE
High risk HPV: NEGATIVE

## 2021-12-23 ENCOUNTER — Other Ambulatory Visit (HOSPITAL_COMMUNITY): Payer: Self-pay | Admitting: Nurse Practitioner

## 2021-12-23 DIAGNOSIS — Z1231 Encounter for screening mammogram for malignant neoplasm of breast: Secondary | ICD-10-CM

## 2022-01-03 ENCOUNTER — Ambulatory Visit (HOSPITAL_COMMUNITY)
Admission: RE | Admit: 2022-01-03 | Discharge: 2022-01-03 | Disposition: A | Discharge status: Home / Self Care | Payer: BC Managed Care – PPO | Source: Ambulatory Visit | Attending: Nurse Practitioner | Admitting: Nurse Practitioner

## 2022-01-03 DIAGNOSIS — Z1231 Encounter for screening mammogram for malignant neoplasm of breast: Secondary | ICD-10-CM | POA: Insufficient documentation

## 2022-02-03 ENCOUNTER — Ambulatory Visit (INDEPENDENT_AMBULATORY_CARE_PROVIDER_SITE_OTHER): Payer: BC Managed Care – PPO

## 2022-02-03 ENCOUNTER — Ambulatory Visit (INDEPENDENT_AMBULATORY_CARE_PROVIDER_SITE_OTHER): Payer: BC Managed Care – PPO | Admitting: Podiatry

## 2022-02-03 DIAGNOSIS — M2042 Other hammer toe(s) (acquired), left foot: Secondary | ICD-10-CM | POA: Diagnosis not present

## 2022-02-03 DIAGNOSIS — B351 Tinea unguium: Secondary | ICD-10-CM | POA: Diagnosis not present

## 2022-02-03 DIAGNOSIS — M2041 Other hammer toe(s) (acquired), right foot: Secondary | ICD-10-CM | POA: Diagnosis not present

## 2022-02-04 NOTE — Progress Notes (Signed)
Subjective:   Patient ID: Kristie Hansen, female   DOB: 66 y.o.   MRN: [id number]   HPI Patient presents stating she has developed progressive hammertoes on her left foot which gets sore and also has some discoloration of nailbeds with thickness that she is concerned about.  States she is having trouble wearing shoes has tried wider shoes cushions and paddings and it is no longer being effective to help her with this and it seems to be getting worse.  Patient does not smoke likes to be active   Review of Systems  All other systems reviewed and are negative.       Objective:  Physical Exam Vitals and nursing note reviewed.  Constitutional:      Appearance: She is well-developed.  Pulmonary:     Effort: Pulmonary effort is normal.  Musculoskeletal:        General: Normal range of motion.  Skin:    General: Skin is warm.  Neurological:     Mental Status: She is alert.     Neurovascular status found to be intact muscle strength found to be adequate range of motion within normal limits.  Patient is noted to have significant elevation of the second digit in the sagittal pain with moderate transverse plane deformity noted and also elevation and medial rotation of the third digit left and distal deformity of the fourth digit left.  Right foot shows mild deformity not to the same degree as the left with patient having progression over the last 6 months.  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  She is tried conservative treatment different avenues I would like to pursue a surgical correction and I recommended digital fusion of digits 2 3 and distal arthroplasty digit for left.  I explained the transverse deformity will  not be all correctable but I do think the toes will sit in a good alignment and will take care of the pain she gets from rubbing in the sagittal plane.  I allowed her to read a consent form going over alternative treatments complications and at this point she wants surgery signed consent form.  She is scheduled for outpatient surgery agrees for specialty surgical center all questions answered and she does understand that she will be in an open toed shoe for approximate 6 weeks total recovery.  Will take around 6 months.  Do not recommend current treatment of the nails as I think a lot of it is trauma and we could consider oral fungal once surgical intervention is complete  X-rays indicate that there is significant distal structural deformity of the lesser digits left over right foot with the fourth toe right being deformed

## 2022-04-02 ENCOUNTER — Other Ambulatory Visit: Payer: Self-pay | Admitting: Nurse Practitioner

## 2022-04-04 NOTE — Telephone Encounter (Signed)
Last AEX 12/15/2021--nothing scheduled yet for 2024.  Last Rxd by Dr. Toney Rakes in 2017.

## 2022-09-08 ENCOUNTER — Encounter: Payer: Self-pay | Admitting: Nurse Practitioner

## 2022-09-08 ENCOUNTER — Ambulatory Visit: Payer: BC Managed Care – PPO | Admitting: Nurse Practitioner

## 2022-09-08 VITALS — BP 142/88 | HR 73

## 2022-09-08 DIAGNOSIS — N951 Menopausal and female climacteric states: Secondary | ICD-10-CM | POA: Diagnosis not present

## 2022-09-08 DIAGNOSIS — N362 Urethral caruncle: Secondary | ICD-10-CM | POA: Diagnosis not present

## 2022-09-08 MED ORDER — ESTRADIOL 0.1 MG/GM VA CREA: 1.0000 g | TOPICAL_CREAM | VAGINAL | 0 refills | Status: DC

## 2022-09-08 NOTE — Progress Notes (Signed)
   Acute Office Visit  Subjective:    Patient ID: Kristie Hansen, female    DOB: 11-Jan-1956, 67 y.o.   MRN: [id number]   HPI 67 y.o. presents today for questions regarding vaginal estrogen. Has noticed much improvement with use but has not been using consistently. Ran out and has not refilled. Wants to continue but wants to make sure she is using correctly. Reports that her urine stream does not go straight down any more but more so spreads out. No trouble urination, no pain.    Review of Systems  Constitutional: Negative.   Genitourinary:  Negative for difficulty urinating, dysuria, genital sores, hematuria, urgency, vaginal bleeding, vaginal discharge and vaginal pain.       Change in urine stream       Objective:    Physical Exam Constitutional:      Appearance: Normal appearance.  Genitourinary:    Urethra: No prolapse or urethral pain.     Comments: Protruding tissue from urethra c/w caruncle    BP (!) 142/88   Pulse 73   SpO2 94%  Wt Readings from Last 3 Encounters:  12/15/21 161 lb (73 kg)  06/29/20 165 lb (74.8 kg)  05/20/19 165 lb (74.8 kg)        Patient informed chaperone available to be present for breast and/or pelvic exam. Patient has requested no chaperone to be present. Patient has been advised what will be completed during breast and pelvic exam.   Assessment & Plan:   Problem List Items Addressed This Visit   None Visit Diagnoses     Menopausal vaginal dryness    -  Primary   Relevant Medications   estradiol (ESTRACE) 0.1 MG/GM vaginal cream   Urethral caruncle          Plan: Discussed presence of urethral caruncle. Could be the cause for the change in her urine stream. Reassured of it's benign nature. Will return to using vaginal estrogen twice weekly. Educated on proper use. Refill provided.      Tamela Gammon DNP, 12:46 PM 09/08/2022

## 2022-10-26 ENCOUNTER — Ambulatory Visit (INDEPENDENT_AMBULATORY_CARE_PROVIDER_SITE_OTHER): Payer: BC Managed Care – PPO | Admitting: Podiatry

## 2022-10-26 ENCOUNTER — Ambulatory Visit (INDEPENDENT_AMBULATORY_CARE_PROVIDER_SITE_OTHER): Payer: BC Managed Care – PPO

## 2022-10-26 ENCOUNTER — Encounter: Payer: Self-pay | Admitting: Podiatry

## 2022-10-26 DIAGNOSIS — M2041 Other hammer toe(s) (acquired), right foot: Secondary | ICD-10-CM | POA: Diagnosis not present

## 2022-10-26 DIAGNOSIS — M2042 Other hammer toe(s) (acquired), left foot: Secondary | ICD-10-CM

## 2022-10-26 NOTE — Progress Notes (Signed)
Subjective:   Patient ID: Kristie Hansen, female   DOB: 67 y.o.   MRN: [id number]   HPI Patient presents with a lot of problems of the second third and fourth toe left foot that become painful and also a keratotic lesion on the left big toe that becomes painful.  She is wanted to have surgery on this and her time is now calm she can do this   ROS      Objective:  Physical Exam  Neurovascular status intact with significant elevation digits 2 3 left with distal rotation digit 4 and keratotic excess ptotic area on the left big toe     Assessment:  Hammertoe deformity digits 234 and exostosis keratotic lesion left hallux     Plan:  H&P reviewed condition discussed at great length.  I recommended digital fusion digits 2 3 distal arthroplasty digit 4 and medial exostectomy of the left big toe hoping we can get rid of the chronic lesion there.  I allowed her to read consent form going over at great length the surgery and recovery and the fact total recovery can take around 6 months.  Patient wants surgery signed consent form after review scheduled for outpatient surgery all questions answered today  X-rays indicate significant elevation of digits 2 3 left with distal rotation digit 4 left foot

## 2022-11-16 ENCOUNTER — Telehealth: Payer: Self-pay

## 2022-11-16 NOTE — Telephone Encounter (Signed)
Kristie Hansen called to reschedule her surgery with Dr, Charlsie Merles on 12/06/2022. I have her rescheduled to 01/03/2023. Notified Dr. Charlsie Merles and Aram Beecham at [facility]

## 2022-11-17 ENCOUNTER — Telehealth: Payer: Self-pay | Admitting: Urology

## 2022-11-17 NOTE — Telephone Encounter (Signed)
DOS - 01/03/23  HAMMERTOE REPAIR 2-4 LEFT --- [address] EXOSTECTOMY LET HALLUX --- [address]  [facility] EFFECTIVE DATE - 10/24/22  DEDUCTIBLE - $0.00 OOP - $0.00 COINSURANCE - 0%  RECEIVED A VOICEMAIL FROM JESS WITH [employer] STATING THAT CPT  CODES 60454 AND [id number] NO AUTH IS REQUIRED. HER PHONE NUMBER IS [phone]. [id number].

## 2022-12-12 ENCOUNTER — Encounter: Payer: Medicare Other | Admitting: Podiatry

## 2022-12-26 ENCOUNTER — Encounter: Payer: Medicare Other | Admitting: Podiatry

## 2023-01-02 MED ORDER — HYDROCODONE-ACETAMINOPHEN 10-325 MG PO TABS
1.0000 | ORAL_TABLET | Freq: Three times a day (TID) | ORAL | 0 refills | Status: AC | PRN
Start: 2023-01-02 — End: 2023-01-07

## 2023-01-02 NOTE — Addendum Note (Signed)
Addended by: Lenn Sink on: 01/02/2023 05:07 PM   Modules accepted: Orders

## 2023-01-03 ENCOUNTER — Encounter: Payer: Self-pay | Admitting: Podiatry

## 2023-01-03 DIAGNOSIS — M25775 Osteophyte, left foot: Secondary | ICD-10-CM | POA: Diagnosis not present

## 2023-01-03 DIAGNOSIS — M2042 Other hammer toe(s) (acquired), left foot: Secondary | ICD-10-CM | POA: Diagnosis not present

## 2023-01-09 ENCOUNTER — Encounter: Payer: Medicare Other | Admitting: Podiatry

## 2023-01-11 ENCOUNTER — Ambulatory Visit (INDEPENDENT_AMBULATORY_CARE_PROVIDER_SITE_OTHER): Payer: Medicare Other

## 2023-01-11 ENCOUNTER — Encounter: Payer: Self-pay | Admitting: Podiatry

## 2023-01-11 ENCOUNTER — Ambulatory Visit (INDEPENDENT_AMBULATORY_CARE_PROVIDER_SITE_OTHER): Payer: Medicare Other | Admitting: Podiatry

## 2023-01-11 DIAGNOSIS — Z9889 Other specified postprocedural states: Secondary | ICD-10-CM | POA: Diagnosis not present

## 2023-01-11 NOTE — Progress Notes (Signed)
Subjective:   Patient ID: Kristie Hansen, female   DOB: 67 y.o.   MRN: [id number]   HPI Patient states she is doing good and states that overall minimal pain   ROS      Objective:  Physical Exam  Neurovascular status intact negative Denna Haggard' sign noted digits in good alignment pins intact wound edges coapted well     Assessment:  Doing well overall digital fusions to 3 and arthroplasty for with slight elevation of the second toe     Plan:  H&P reapplied sterile dressing instructed lowering the second toe and continue to do that reappoint 2 weeks suture removal earlier if needed

## 2023-01-12 ENCOUNTER — Encounter: Payer: Self-pay | Admitting: Podiatry

## 2023-01-12 ENCOUNTER — Ambulatory Visit (INDEPENDENT_AMBULATORY_CARE_PROVIDER_SITE_OTHER): Payer: Medicare Other | Admitting: Podiatry

## 2023-01-12 ENCOUNTER — Ambulatory Visit: Payer: Medicare Other

## 2023-01-12 ENCOUNTER — Telehealth: Payer: Self-pay | Admitting: Podiatry

## 2023-01-12 VITALS — BP 137/71 | HR 62

## 2023-01-12 DIAGNOSIS — Z9889 Other specified postprocedural states: Secondary | ICD-10-CM

## 2023-01-12 NOTE — Telephone Encounter (Signed)
Patient called noting she is Dr. Beverlee Nims p/o patient and was here yesterday.  States that the 2nd toe was sitting up a little and he taped it down when redressing it.  Today, she stated the pin in the 2nd toe is now sticking out about 1" from the toe and the pin cap fell off.  She was advised to not push the pin back in.  She thinks it's sticking out too far to try and tape it, and is wondering if it's so far out that it's not going across the joint anymore.  Please advise if you want to see her today for x-ray.

## 2023-01-12 NOTE — Telephone Encounter (Signed)
I would have her run in tomorrow morning to see if I can push the pin in for her

## 2023-01-13 ENCOUNTER — Ambulatory Visit (INDEPENDENT_AMBULATORY_CARE_PROVIDER_SITE_OTHER): Payer: Medicare Other | Admitting: Podiatry

## 2023-01-13 DIAGNOSIS — M2042 Other hammer toe(s) (acquired), left foot: Secondary | ICD-10-CM

## 2023-01-13 DIAGNOSIS — M2041 Other hammer toe(s) (acquired), right foot: Secondary | ICD-10-CM

## 2023-01-14 NOTE — Progress Notes (Signed)
Subjective:   Patient ID: Kristie Hansen, female   DOB: 66 y.o.   MRN: [id number]   HPI Patient presents stating my opinion on the second digit came out by around an inch and I am not sure what happened   ROS      Objective:  Physical Exam  Neurovascular status intact negative Denna Haggard' sign noted pin has protruded by about an inch on the left second toe third pin is in the perfect position     Assessment:  Probability that there was some trauma or something that created a torsion on the pin and that it did pull out unfortunately about an inch     Plan:  I did anesthetize 60 mg like Marcaine mixture to the digit I then pushed it back in again I did take an x-ray and found to be in good position and explained it may stay at this position or unfortunately it may come back out again and if it does we will need to remove the pin.  Patient had sterile dressing reapplied  X-rays indicate the pin is still in good position and the second digit left foot

## 2023-01-15 NOTE — Progress Notes (Signed)
Subjective:   Patient ID: Kristie Hansen, female   DOB: 67 y.o.   MRN: [id number]   HPI Patient states that the pin started to pop out again this morning and is back to where it was   ROS      Objective:  Physical Exam  Neurovascular status intact with a pin that backed out by around an inch left second toe     Assessment:  Pin that is no longer stable left second toe     Plan:  Pin removal accomplished sterile dressing applied and the toe was splinted and will be Splinted the next few weeks and should be fine.  Reappoint for regular visit earlier if needed

## 2023-01-16 ENCOUNTER — Telehealth: Payer: Self-pay | Admitting: *Deleted

## 2023-01-16 NOTE — Telephone Encounter (Signed)
Patient is calling because her pin is coming out of another toe, very painful, wants to remove herself.   She called back and has removed the pin and wanted to let the physician aware and will see him for f/u on 07/01.

## 2023-01-16 NOTE — Telephone Encounter (Signed)
Thanks, tell her to put a bandaid on top

## 2023-01-16 NOTE — Telephone Encounter (Signed)
Called patient and updated thru voice message.

## 2023-01-20 ENCOUNTER — Other Ambulatory Visit: Payer: Self-pay | Admitting: Podiatry

## 2023-01-20 MED ORDER — DOXYCYCLINE HYCLATE 100 MG PO TABS
100.0000 mg | ORAL_TABLET | Freq: Two times a day (BID) | ORAL | 0 refills | Status: DC
Start: 2023-01-20 — End: 2023-02-14

## 2023-01-20 NOTE — Progress Notes (Signed)
Patient called concerned for redness and increased swelling to the third surgical toe. Has appt Monday, 7/1, with Dr. Charlsie Merles. Rx doxycycline 100mg  #14 sent to the pharmacy.   Felecia Shelling, DPM [facility]  Dr. Felecia Shelling, DPM    [address] [place], [place] [address]                Office [phone]  Fax [phone]

## 2023-01-23 ENCOUNTER — Telehealth: Payer: Self-pay | Admitting: Podiatry

## 2023-01-23 ENCOUNTER — Encounter: Payer: Medicare Other | Admitting: Podiatry

## 2023-01-23 ENCOUNTER — Encounter: Payer: Self-pay | Admitting: Podiatry

## 2023-01-23 ENCOUNTER — Ambulatory Visit (INDEPENDENT_AMBULATORY_CARE_PROVIDER_SITE_OTHER): Payer: Medicare Other | Admitting: Podiatry

## 2023-01-23 DIAGNOSIS — Z9889 Other specified postprocedural states: Secondary | ICD-10-CM

## 2023-01-23 NOTE — Telephone Encounter (Signed)
Pts daughter Rosalyn Gess left message Friday 6.28 @ 217pm staing pts mom has had surgery on both feet  and they think another pin is coming out and may have possible infection.    Upon checking pt was scheduled to come in to see Dr Charlsie Merles today.7.1.2024

## 2023-01-24 NOTE — Progress Notes (Signed)
Subjective:   Patient ID: Kristie Hansen, female   DOB: 67 y.o.   MRN: [id number]   HPI Patient states overall doing well I did lose the pin in the right third digit at home   ROS      Objective:  Physical Exam  Vascular status intact negative Denna Haggard' sign noted digits are in relatively good alignment mild swelling noted wound edges well coapted stitches intact     Assessment:  Oh overall healing well has lost her pins a little bit earlier than I would have liked     Plan:  Reviewed condition and stitches removed wound edges coapted well instructed on keeping the toes in good position reappoint in the next 3 to 4 weeks or earlier if any issues were to occur

## 2023-01-30 ENCOUNTER — Other Ambulatory Visit: Payer: Self-pay | Admitting: Podiatry

## 2023-01-30 ENCOUNTER — Ambulatory Visit (INDEPENDENT_AMBULATORY_CARE_PROVIDER_SITE_OTHER): Payer: Medicare Other | Admitting: Podiatry

## 2023-01-30 ENCOUNTER — Encounter: Payer: Self-pay | Admitting: Podiatry

## 2023-01-30 ENCOUNTER — Ambulatory Visit (INDEPENDENT_AMBULATORY_CARE_PROVIDER_SITE_OTHER): Payer: Medicare Other

## 2023-01-30 DIAGNOSIS — Z9889 Other specified postprocedural states: Secondary | ICD-10-CM

## 2023-01-30 NOTE — Progress Notes (Signed)
Subjective:   Patient ID: Kristie Hansen, female   DOB: 67 y.o.   MRN: [id number]   HPI Patient presents concerned about swelling of the third digit left foot and also is unsure whether there may be some suture still left   ROS      Objective:  Physical Exam  Neurovascular status intact negative Denna Haggard' sign noted patient is approximate 1 month after having digital fusions digits 2 3 left arthroplasty digit 4 with incision sites of healed well no drainage there is some swelling third digit left and an area of constriction where the dressing may have irritated the tissue but it is localized and should resolve over time     Assessment:  For 4 weeks after this is a very normal appearance of the incision sites with swelling of the moderate increased third digit which at times can happen     Plan:  H&P precautionary x-ray taken I compress the third digit to try to take out the swelling and tried to assure her that it still very quick after surgery and it is normal to still have the swelling pattern that she has.  I want to see her in 6 weeks or earlier if any issues were to occur  X-rays indicate it appears the bone is healing well difficult to make complete determination as it is very early but overall I do think that gradual fusion will occur

## 2023-02-14 ENCOUNTER — Ambulatory Visit: Payer: Medicare Other | Admitting: Nurse Practitioner

## 2023-02-14 ENCOUNTER — Encounter: Payer: Self-pay | Admitting: Nurse Practitioner

## 2023-02-14 VITALS — BP 136/84 | HR 73 | Ht 65.0 in | Wt 158.0 lb

## 2023-02-14 DIAGNOSIS — N951 Menopausal and female climacteric states: Secondary | ICD-10-CM | POA: Diagnosis not present

## 2023-02-14 DIAGNOSIS — Z01419 Encounter for gynecological examination (general) (routine) without abnormal findings: Secondary | ICD-10-CM | POA: Diagnosis not present

## 2023-02-14 MED ORDER — ESTRADIOL 0.1 MG/GM VA CREA
1.0000 g | TOPICAL_CREAM | VAGINAL | 1 refills | Status: DC
Start: 2023-02-16 — End: 2024-02-28

## 2023-02-14 NOTE — Progress Notes (Signed)
   Kristie Hansen 07/19/1956 [id number]   History:  67 y.o. G2P2 presents for annual exam. Postmenopausal. Using vaginal estrogen but not consistently for dryness and painful intercourse. Discontinued for hammer toe surgery in June. Plans to restart. Normal pap and mammogram history. Hypothyroidism managed by PCP.   Gynecologic History No LMP recorded. Patient is postmenopausal.   Sexually active: Yes  Health maintenance Last Pap: 12/15/2021. Results were: Normal neg HPV Last mammogram: 01/03/2022. Results were: Normal Last colonoscopy: 2022. Results were: Normal Last Dexa: 08/20/2020. Results were: Normal  Past medical history, past surgical history, family history and social history were all reviewed and documented in the EPIC chart. Married. Daughters Financial controller and [employer]. 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   ROS:  A ROS was performed and pertinent positives and negatives are included.  Exam:  Vitals:   02/14/23 1521  BP: 136/84  Pulse: 73  SpO2: 100%  Weight: 158 lb (71.7 kg)  Height: 5\' 5"  (1.651 m)     Body mass index is 26.29 kg/m.  General appearance:  Normal Thyroid:  Symmetrical, normal in size, without palpable masses or nodularity. Respiratory  Auscultation:  Clear without wheezing or rhonchi Cardiovascular  Auscultation:  Regular rate, without rubs, murmurs or gallops  Edema/varicosities:  Not grossly evident Abdominal  Soft,nontender, without masses, guarding or rebound.  Liver/spleen:  No organomegaly noted  Hernia:  None appreciated  Skin  Inspection:  Grossly normal   Breasts: Examined lying and sitting.   Right: Without masses, retractions, discharge or axillary adenopathy.   Left: Without masses, retractions, discharge or axillary adenopathy. Genitourinary   Inguinal/mons:  Normal without inguinal adenopathy  External genitalia:  Urethral caruncle  BUS/Urethra/Skene's glands:  Normal  Vagina:  Normal appearing with normal color and discharge, no  lesions. Atrophic changes  Cervix:  Normal appearing without discharge or lesions  Uterus:  Normal in size, shape and contour.  Midline and mobile, nontender  Adnexa/parametria:     Rt: Normal in size, without masses or tenderness.   Lt: Normal in size, without masses or tenderness.  Anus and perineum: Normal  Digital rectal exam: Not indicated  Patient informed chaperone available to be present for breast and pelvic exam. Patient has requested no chaperone to be present. Patient has been advised what will be completed during breast and pelvic exam.   Assessment/Plan:  67 y.o. G2P2 for annual exam.   Well female exam with routine gynecological exam - Education provided on SBEs, importance of preventative screenings, current guidelines, high calcium diet, regular exercise, and multivitamin daily. Labs with PCP.   Menopausal vaginal dryness - Plan: estradiol (ESTRACE) 0.1 MG/GM vaginal cream twice weekly. Recommend consistent use for best results.   Screening for cervical cancer - Normal pap history. No longer screening per guidelines.   Screening for breast cancer - Normal mammogram history.  Overdue for screening mammogram and she plans to schedule this soon.  Discussed current guidelines and importance of preventative screenings.  Normal breast exam today.  Screening for colon cancer - Normal colonoscopy in 2022.  She will repeat at [facility] recommended interval.  Screening for osteoporosis - Normal bone density in 2022.  Follow-up in 1 year for annual.     Olivia Mackie [facility], 3:47 PM 02/14/2023

## 2023-05-16 ENCOUNTER — Other Ambulatory Visit (HOSPITAL_COMMUNITY): Payer: Self-pay | Admitting: Nurse Practitioner

## 2023-05-16 DIAGNOSIS — Z1231 Encounter for screening mammogram for malignant neoplasm of breast: Secondary | ICD-10-CM

## 2023-05-29 ENCOUNTER — Ambulatory Visit (HOSPITAL_COMMUNITY)
Admission: RE | Admit: 2023-05-29 | Discharge: 2023-05-29 | Disposition: A | Discharge status: Home / Self Care | Payer: Medicare Other | Source: Ambulatory Visit | Attending: Nurse Practitioner | Admitting: Nurse Practitioner

## 2023-05-29 ENCOUNTER — Encounter (HOSPITAL_COMMUNITY): Payer: Self-pay

## 2023-05-29 DIAGNOSIS — Z1231 Encounter for screening mammogram for malignant neoplasm of breast: Secondary | ICD-10-CM | POA: Insufficient documentation

## 2023-08-07 IMAGING — MG MM DIGITAL SCREENING BILAT W/ TOMO AND CAD
6 of 10 series · 6 of 30 positions shown · non-contrast
Comparison: Previous exam(s).

CLINICAL DATA: Screening.

EXAM:
DIGITAL SCREENING BILATERAL MAMMOGRAM WITH TOMOSYNTHESIS AND CAD
TECHNIQUE: Bilateral screening digital craniocaudal and mediolateral oblique
mammograms were obtained. Bilateral screening digital breast
tomosynthesis was performed. The images were evaluated with
computer-aided detection.

[L MLO synth-2D (1 of 2)]
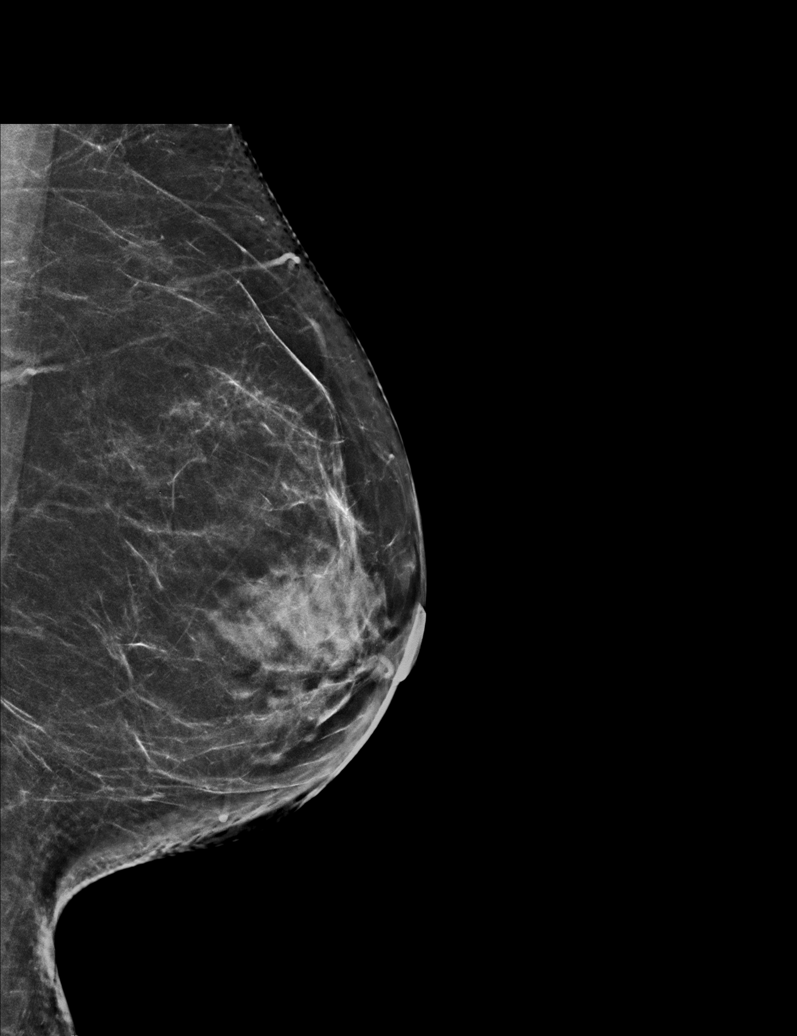

[L MLO synth-2D (2 of 2)]
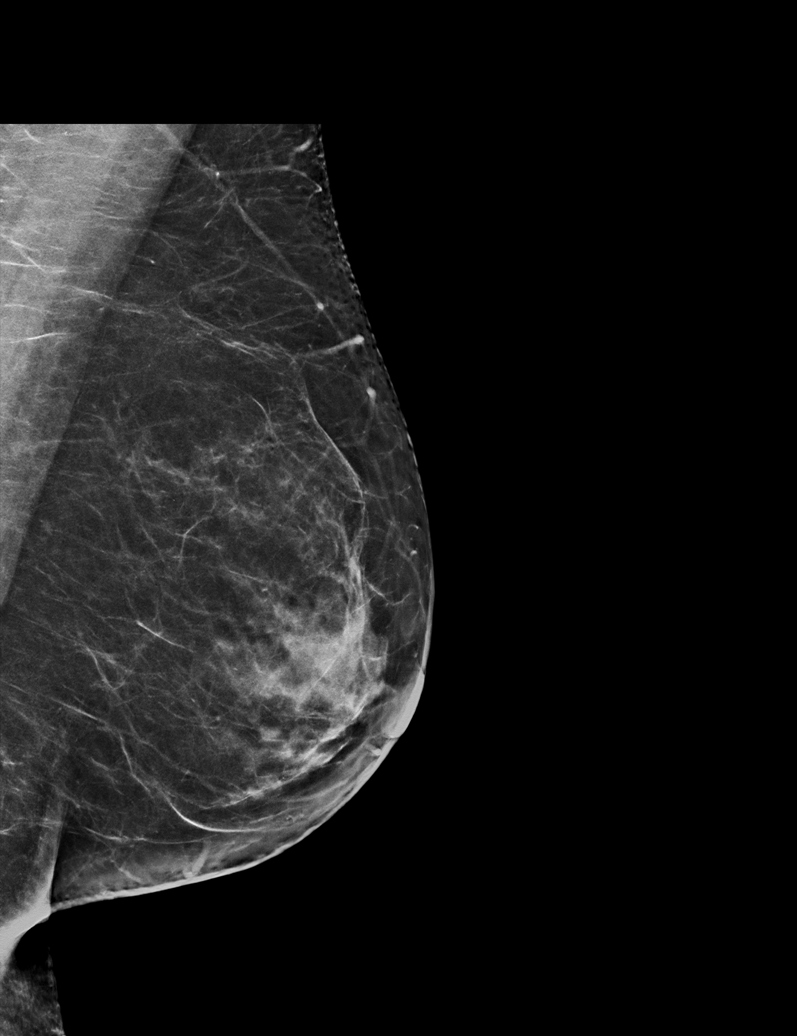

[R CC synth-2D]
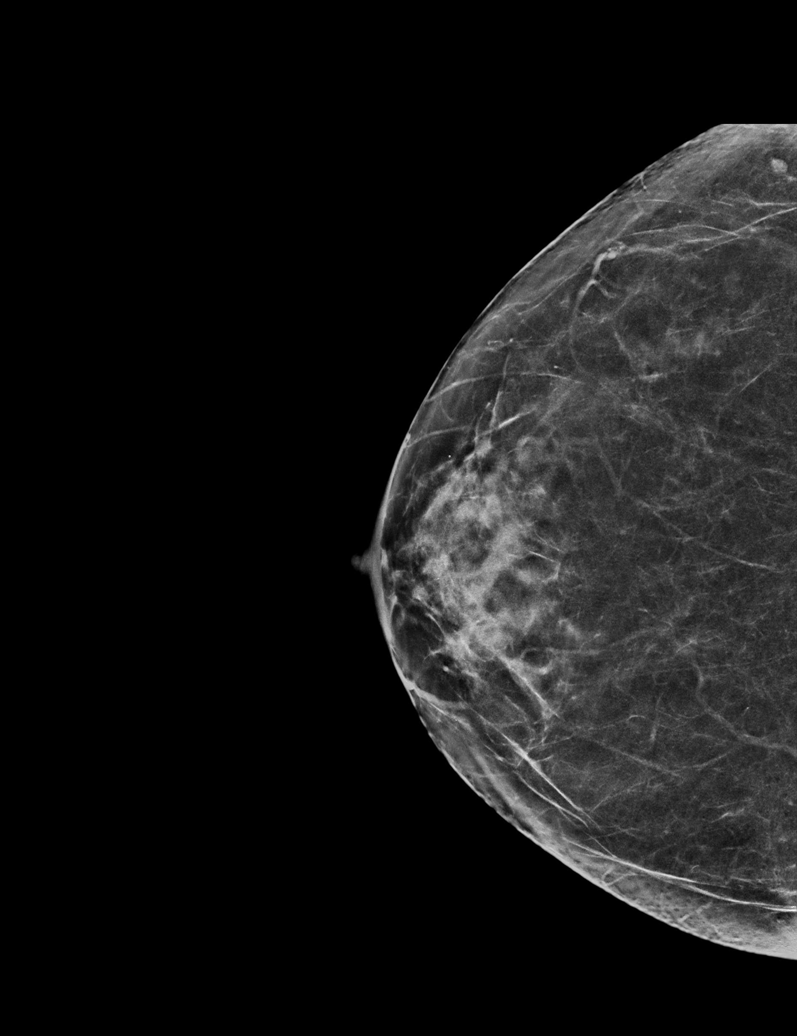

[L CC synth-2D]
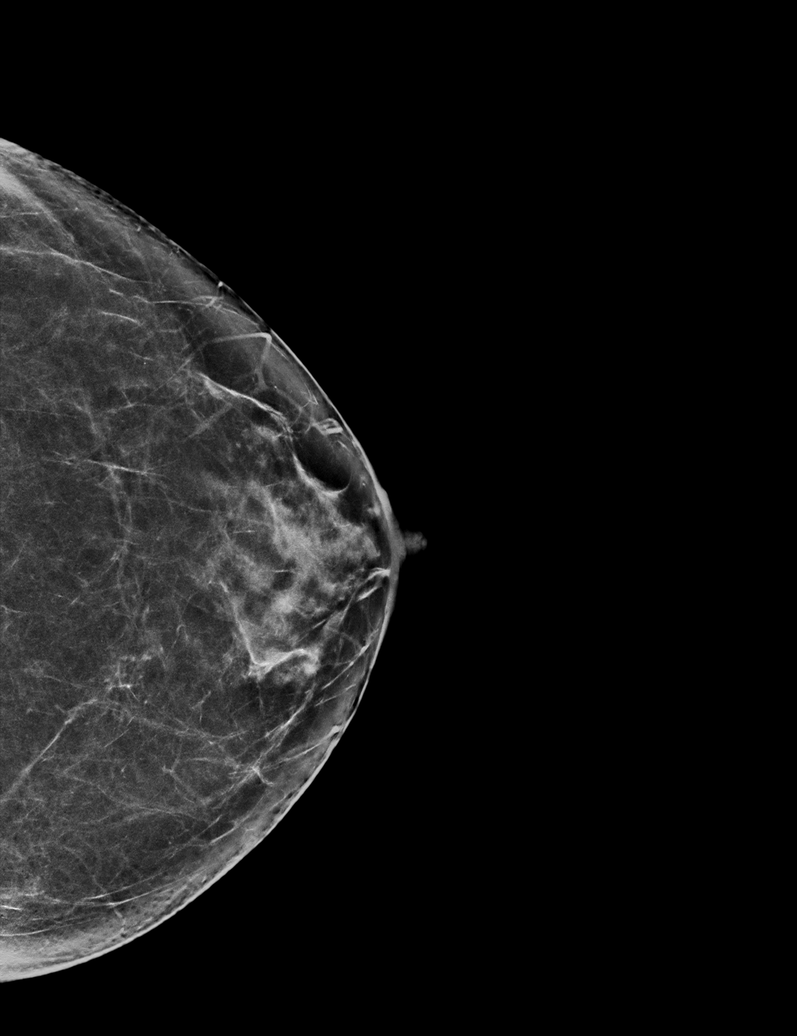

[R MLO synth-2D]
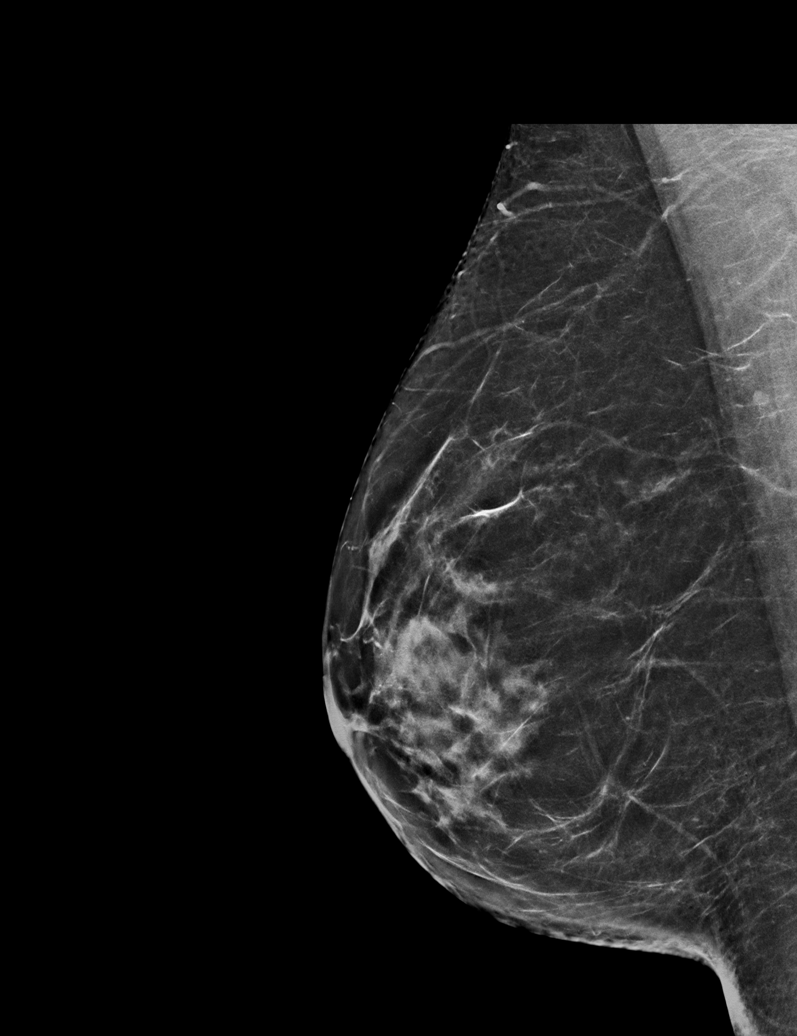

[L CC tomo · tomo slice 33/65.0]
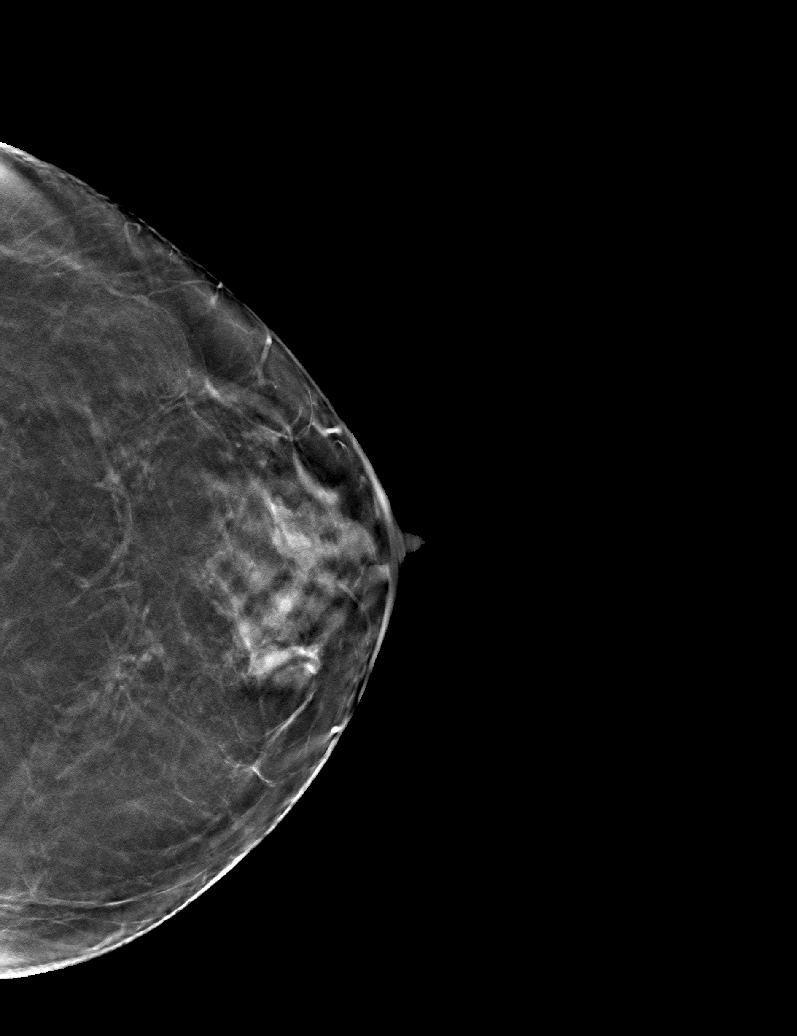

[6 of 30 positions shown; findings below may reference images not displayed]

ACR Breast Density Category b: There are scattered areas of
fibroglandular density.
FINDINGS: There are no findings suspicious for malignancy.
IMPRESSION: No mammographic evidence of malignancy. A result letter of this
screening mammogram will be mailed directly to the patient.

RECOMMENDATION:
Screening mammogram in one year. (Code:51-O-LD2)

BI-RADS CATEGORY  1: Negative.

## 2024-02-28 ENCOUNTER — Ambulatory Visit (INDEPENDENT_AMBULATORY_CARE_PROVIDER_SITE_OTHER): Admitting: Nurse Practitioner

## 2024-02-28 ENCOUNTER — Encounter: Payer: Self-pay | Admitting: Nurse Practitioner

## 2024-02-28 VITALS — BP 114/76 | HR 70 | Ht 63.5 in | Wt 173.0 lb

## 2024-02-28 DIAGNOSIS — R2231 Localized swelling, mass and lump, right upper limb: Secondary | ICD-10-CM

## 2024-02-28 DIAGNOSIS — N941 Unspecified dyspareunia: Secondary | ICD-10-CM | POA: Diagnosis not present

## 2024-02-28 DIAGNOSIS — N951 Menopausal and female climacteric states: Secondary | ICD-10-CM

## 2024-02-28 DIAGNOSIS — Z7689 Persons encountering health services in other specified circumstances: Secondary | ICD-10-CM | POA: Diagnosis not present

## 2024-02-28 MED ORDER — ESTRADIOL 0.1 MG/GM VA CREA: 1.0000 g | TOPICAL_CREAM | VAGINAL | 2 refills | Status: AC

## 2024-02-28 NOTE — Progress Notes (Signed)
   Kristie Hansen 1956-04-18 [id number]   History:  68 y.o. G2P2 presents for medication management. Using vaginal estrogen for dryness and painful intercourse. Complains of lump under right arm. Has intermittent reactive axillary lymph nodes in this area. Had this area checked a couple of months ago by PCP. Resolved and came back. Does not notice it much today, not painful. Has not been sick. Normal mammogram November 2024. Has been struggling with weight gain. Has gained 10 pounds in a year. Does not do active exercise like she used to and eats late.   Past medical history, past surgical history, family history and social history were all reviewed and documented in the EPIC chart.  ROS:  A ROS was performed and pertinent positives and negatives are included.  Exam:  Vitals:   02/28/24 1334  BP: 114/76  Pulse: 70  SpO2: 99%  Weight: 173 lb (78.5 kg)  Height: 5' 3.5 (1.613 m)   Body mass index is 30.16 kg/m.  General appearance: Normal Breasts: Examined lying and sitting.   Right: Without masses, retractions, nipple discharge or axillary adenopathy. Area that appears to be a fat pad in lower axillary region, non tender.    Left: Without masses, retractions, nipple discharge or axillary adenopathy.  Assessment/Plan:  68 y.o. G2P2 for medication management.   Menopausal vaginal dryness - Plan: estradiol  (ESTRACE ) 0.1 MG/GM vaginal cream twice weekly.   Dyspareunia in female - Plan: estradiol  (ESTRACE ) 0.1 MG/GM vaginal cream twice weekly.   Encounter for weight management - Discussed intermittent fasting, importance of calorie deficit for weight loss, increase exercise, avoid late night eating, importance of good sleep.   Mass of right axilla - appears to be fat pad. Will monitor.   Return in about 1 year (around 02/27/2025) for B&P.  Kristie DELENA Shutter DNP, 4:28 PM 02/28/2024
